# Patient Record
Sex: Female | Born: 1980 | Race: Black or African American | Hispanic: No | Marital: Married | Smoking: Never smoker
Health system: Southern US, Community
[De-identification: ages and names within clinical notes are randomized; demographics above are authoritative.]

## PROBLEM LIST (undated history)

## (undated) DIAGNOSIS — F419 Anxiety disorder, unspecified: Secondary | ICD-10-CM

## (undated) DIAGNOSIS — F32A Depression, unspecified: Secondary | ICD-10-CM

## (undated) DIAGNOSIS — Z8041 Family history of malignant neoplasm of ovary: Secondary | ICD-10-CM

## (undated) DIAGNOSIS — D259 Leiomyoma of uterus, unspecified: Secondary | ICD-10-CM

## (undated) DIAGNOSIS — D649 Anemia, unspecified: Secondary | ICD-10-CM

## (undated) HISTORY — DX: Depression, unspecified: F32.A

## (undated) HISTORY — PX: TUBAL LIGATION: SHX77

## (undated) HISTORY — DX: Anxiety disorder, unspecified: F41.9

## (undated) HISTORY — PX: OTHER SURGICAL HISTORY: SHX169

## (undated) HISTORY — DX: Family history of malignant neoplasm of ovary: Z80.41

---

## 2006-08-08 ENCOUNTER — Emergency Department (HOSPITAL_COMMUNITY): Admission: EM | Admit: 2006-08-08 | Discharge: 2006-08-08 | Payer: Self-pay | Admitting: Emergency Medicine

## 2006-10-05 ENCOUNTER — Emergency Department (HOSPITAL_COMMUNITY): Admission: EM | Admit: 2006-10-05 | Discharge: 2006-10-05 | Payer: Self-pay | Admitting: Emergency Medicine

## 2012-07-30 ENCOUNTER — Encounter (HOSPITAL_BASED_OUTPATIENT_CLINIC_OR_DEPARTMENT_OTHER): Payer: Self-pay | Admitting: *Deleted

## 2012-07-30 ENCOUNTER — Emergency Department (HOSPITAL_BASED_OUTPATIENT_CLINIC_OR_DEPARTMENT_OTHER)
Admission: EM | Admit: 2012-07-30 | Discharge: 2012-07-30 | Disposition: A | Discharge status: Home / Self Care | Payer: BC Managed Care – PPO | Attending: Emergency Medicine | Admitting: Emergency Medicine

## 2012-07-30 DIAGNOSIS — R5381 Other malaise: Secondary | ICD-10-CM | POA: Insufficient documentation

## 2012-07-30 DIAGNOSIS — R5383 Other fatigue: Secondary | ICD-10-CM | POA: Insufficient documentation

## 2012-07-30 DIAGNOSIS — N390 Urinary tract infection, site not specified: Secondary | ICD-10-CM | POA: Insufficient documentation

## 2012-07-30 LAB — URINALYSIS, ROUTINE W REFLEX MICROSCOPIC
Nitrite: POSITIVE — AB
Specific Gravity, Urine: 1.017 (ref 1.005–1.030)
Urobilinogen, UA: 0.2 mg/dL (ref 0.0–1.0)
pH: 5 (ref 5.0–8.0)

## 2012-07-30 LAB — URINE MICROSCOPIC-ADD ON

## 2012-07-30 LAB — PREGNANCY, URINE: Preg Test, Ur: NEGATIVE

## 2012-07-30 MED ORDER — NITROFURANTOIN MONOHYD MACRO 100 MG PO CAPS
100.0000 mg | ORAL_CAPSULE | Freq: Once | ORAL | Status: AC
  Administered 2012-07-30: 100 mg via ORAL
  Filled 2012-07-30: qty 1

## 2012-07-30 MED ORDER — NITROFURANTOIN MONOHYD MACRO 100 MG PO CAPS: 100.0000 mg | ORAL_CAPSULE | Freq: Two times a day (BID) | ORAL | Status: DC

## 2012-07-30 MED ORDER — PHENAZOPYRIDINE HCL 200 MG PO TABS: 200.0000 mg | ORAL_TABLET | Freq: Three times a day (TID) | ORAL | Status: DC

## 2012-07-30 MED ORDER — PHENAZOPYRIDINE HCL 100 MG PO TABS
200.0000 mg | ORAL_TABLET | Freq: Once | ORAL | Status: AC
  Administered 2012-07-30: 200 mg via ORAL
  Filled 2012-07-30: qty 2

## 2012-07-30 NOTE — ED Notes (Signed)
C/o urinary frequency, dysuria, and lower abd pain since midnight. Denies fever or chills.

## 2012-07-30 NOTE — ED Notes (Signed)
MD at bedside. 

## 2012-07-30 NOTE — ED Provider Notes (Signed)
History     CSN: [id number]  Arrival date & time 07/30/12  4098   First MD Initiated Contact with Patient 07/30/12 0324      Chief Complaint  Patient presents with  . Urinary Frequency    (Consider location/radiation/quality/duration/timing/severity/associated sxs/prior treatment) HPI Is a 31 year old female with several day history of urinary frequency, urgency and burning with urination. The symptoms have been moderate. She states her urine has been dark. The symptoms of.been mitigated by increased fluid intake. This morning about midnight she developed right suprapubic pain. She also has general malaise. She denies nausea, vomiting, fever or chills. She denies lower back pain. She states the symptoms are like previous urinary tract infections.  History reviewed. No pertinent past medical history.  History reviewed. No pertinent past surgical history.  History reviewed. No pertinent family history.  History  Substance Use Topics  . Smoking status: Not on file  . Smokeless tobacco: Not on file  . Alcohol Use: No    OB History    Grav Para Term Preterm Abortions TAB SAB Ect Mult Living                  Review of Systems  All other systems reviewed and are negative.    Allergies  Penicillins  Home Medications  No current outpatient prescriptions on file.  BP 112/68  Pulse 82  Temp 98.1 F (36.7 C) (Oral)  Resp 16  Ht 5\' 4"  (1.626 m)  Wt 150 lb (68.04 kg)  BMI 25.75 kg/m2  SpO2 100%  LMP 07/18/2012  Physical Exam General: Well-developed, well-nourished female in no acute distress; appearance consistent with age of record HENT: normocephalic, atraumatic Eyes: pupils equal round and reactive to light; extraocular muscles intact Neck: supple Heart: regular rate and rhythm Lungs: clear to auscultation bilaterally Abdomen: soft; nondistended; right suprapubic tenderness; no masses or hepatosplenomegaly; bowel sounds present GU: No CVA  tenderness Extremities: No deformity; full range of motion Neurologic: Awake, alert and oriented; motor function intact in all extremities and symmetric; no facial droop Skin: Warm and dry Psychiatric: Normal mood and affect    ED Course  Procedures (including critical care time)     MDM   Nursing notes and vitals signs, including pulse oximetry, reviewed.  Summary of this visit's results, reviewed by myself:  Labs:  Results for orders placed during the hospital encounter of 07/30/12 (from the past 24 hour(s))  URINALYSIS, ROUTINE W REFLEX MICROSCOPIC     Status: Abnormal   Collection Time   07/30/12  3:24 AM      Component Value Range   Color, Urine YELLOW  YELLOW   APPearance CLOUDY (*) CLEAR   Specific Gravity, Urine 1.017  1.005 - 1.030   pH 5.0  5.0 - 8.0   Glucose, UA NEGATIVE  NEGATIVE mg/dL   Hgb urine dipstick SMALL (*) NEGATIVE   Bilirubin Urine NEGATIVE  NEGATIVE   Ketones, ur NEGATIVE  NEGATIVE mg/dL   Protein, ur 30 (*) NEGATIVE mg/dL   Urobilinogen, UA 0.2  0.0 - 1.0 mg/dL   Nitrite POSITIVE (*) NEGATIVE   Leukocytes, UA LARGE (*) NEGATIVE  PREGNANCY, URINE     Status: Normal   Collection Time   07/30/12  3:24 AM      Component Value Range   Preg Test, Ur NEGATIVE  NEGATIVE  URINE MICROSCOPIC-ADD ON     Status: Abnormal   Collection Time   07/30/12  3:24 AM      Component Value  Range   Squamous Epithelial / LPF RARE  RARE   WBC, UA TOO NUMEROUS TO COUNT  <3 WBC/hpf   RBC / HPF 7-10  <3 RBC/hpf   Bacteria, UA MANY (*) RARE            Hanley Seamen, MD 07/30/12 [phone]

## 2012-08-01 LAB — URINE CULTURE: Colony Count: >=100000

## 2012-08-02 NOTE — ED Notes (Signed)
+  Urine. Patient treated with Macrobid. Sensitive to same. Per protocol MD. °

## 2014-05-29 ENCOUNTER — Emergency Department (HOSPITAL_BASED_OUTPATIENT_CLINIC_OR_DEPARTMENT_OTHER)
Admission: EM | Admit: 2014-05-29 | Discharge: 2014-05-29 | Disposition: A | Discharge status: Home / Self Care | Payer: BC Managed Care – PPO | Attending: Emergency Medicine | Admitting: Emergency Medicine

## 2014-05-29 ENCOUNTER — Encounter (HOSPITAL_BASED_OUTPATIENT_CLINIC_OR_DEPARTMENT_OTHER): Payer: Self-pay | Admitting: Emergency Medicine

## 2014-05-29 DIAGNOSIS — Z79899 Other long term (current) drug therapy: Secondary | ICD-10-CM | POA: Diagnosis not present

## 2014-05-29 DIAGNOSIS — J029 Acute pharyngitis, unspecified: Secondary | ICD-10-CM | POA: Diagnosis present

## 2014-05-29 DIAGNOSIS — J040 Acute laryngitis: Secondary | ICD-10-CM | POA: Diagnosis not present

## 2014-05-29 DIAGNOSIS — J209 Acute bronchitis, unspecified: Secondary | ICD-10-CM | POA: Diagnosis not present

## 2014-05-29 DIAGNOSIS — Z880 Allergy status to penicillin: Secondary | ICD-10-CM | POA: Diagnosis not present

## 2014-05-29 MED ORDER — AZITHROMYCIN 250 MG PO TABS: ORAL_TABLET | ORAL | Status: DC

## 2014-05-29 MED ORDER — ALBUTEROL SULFATE HFA 108 (90 BASE) MCG/ACT IN AERS: 1.0000 | INHALATION_SPRAY | Freq: Four times a day (QID) | RESPIRATORY_TRACT | Status: DC | PRN

## 2014-05-29 MED ORDER — IBUPROFEN 800 MG PO TABS: 800.0000 mg | ORAL_TABLET | Freq: Three times a day (TID) | ORAL | Status: DC

## 2014-05-29 NOTE — Discharge Instructions (Signed)
Acute Bronchitis Bronchitis is inflammation of the airways that extend from the windpipe into the lungs (bronchi). The inflammation often causes mucus to develop. This leads to a cough, which is the most common symptom of bronchitis.  In acute bronchitis, the condition usually develops suddenly and goes away over time, usually in a couple weeks. Smoking, allergies, and asthma can make bronchitis worse. Repeated episodes of bronchitis may cause further lung problems.  CAUSES Acute bronchitis is most often caused by the same virus that causes a cold. The virus can spread from person to person (contagious) through coughing, sneezing, and touching contaminated objects. SIGNS AND SYMPTOMS   Cough.   Fever.   Coughing up mucus.   Body aches.   Chest congestion.   Chills.   Shortness of breath.   Sore throat.  DIAGNOSIS  Acute bronchitis is usually diagnosed through a physical exam. Your health care provider will also ask you questions about your medical history. Tests, such as chest X-rays, are sometimes done to rule out other conditions.  TREATMENT  Acute bronchitis usually goes away in a couple weeks. Oftentimes, no medical treatment is necessary. Medicines are sometimes given for relief of fever or cough. Antibiotic medicines are usually not needed but may be prescribed in certain situations. In some cases, an inhaler may be recommended to help reduce shortness of breath and control the cough. A cool mist vaporizer may also be used to help thin bronchial secretions and make it easier to clear the chest.  HOME CARE INSTRUCTIONS  Get plenty of rest.   Drink enough fluids to keep your urine clear or pale yellow (unless you have a medical condition that requires fluid restriction). Increasing fluids may help thin your respiratory secretions (sputum) and reduce chest congestion, and it will prevent dehydration.   Take medicines only as directed by your health care provider.  If  you were prescribed an antibiotic medicine, finish it all even if you start to feel better.  Avoid smoking and secondhand smoke. Exposure to cigarette smoke or irritating chemicals will make bronchitis worse. If you are a smoker, consider using nicotine gum or skin patches to help control withdrawal symptoms. Quitting smoking will help your lungs heal faster.   Reduce the chances of another bout of acute bronchitis by washing your hands frequently, avoiding people with cold symptoms, and trying not to touch your hands to your mouth, nose, or eyes.   Keep all follow-up visits as directed by your health care provider.  SEEK MEDICAL CARE IF: Your symptoms do not improve after 1 week of treatment.  SEEK IMMEDIATE MEDICAL CARE IF:  You develop an increased fever or chills.   You have chest pain.   You have severe shortness of breath.  You have bloody sputum.   You develop dehydration.  You faint or repeatedly feel like you are going to pass out.  You develop repeated vomiting.  You develop a severe headache. MAKE SURE YOU:   Understand these instructions.  Will watch your condition.  Will get help right away if you are not doing well or get worse. Document Released: 09/19/2004 Document Revised: 12/27/2013 Document Reviewed: 02/02/2013 ExitCare Patient Information 2015 [employer]. This information is not intended to replace advice given to you by your health care provider. Make sure you discuss any questions you have with your health care provider. Laryngitis At the top of your windpipe is your voice box. It is the source of your voice. Inside your voice box are 2   bands of muscles called vocal cords. When you breathe, your vocal cords are relaxed and open so that air can get into the lungs. When you decide to say something, these cords come together and vibrate. The sound from these vibrations goes into your throat and comes out through your mouth as sound. Laryngitis is an  inflammation of the vocal cords that causes hoarseness, cough, loss of voice, sore throat, and dry throat. Laryngitis can be temporary (acute) or long-term (chronic). Most cases of acute laryngitis improve with time.Chronic laryngitis lasts for more than 3 weeks. CAUSES Laryngitis can often be related to excessive smoking, talking, or yelling, as well as inhalation of toxic fumes and allergies. Acute laryngitis is usually caused by a viral infection, vocal strain, measles or mumps, or bacterial infections. Chronic laryngitis is usually caused by vocal cord strain, vocal cord injury, postnasal drip, growths on the vocal cords, or acid reflux. SYMPTOMS   Cough.  Sore throat.  Dry throat. RISK FACTORS  Respiratory infections.  Exposure to irritating substances, such as cigarette smoke, excessive amounts of alcohol, stomach acids, and workplace chemicals.  Voice trauma, such as vocal cord injury from shouting or speaking too loud. DIAGNOSIS  Your cargiver will perform a physical exam. During the physical exam, your caregiver will examine your throat. The most common sign of laryngitis is hoarseness. Laryngoscopy may be necessary to confirm the diagnosis of this condition. This procedure allows your caregiver to look into the larynx. HOME CARE INSTRUCTIONS  Drink enough fluids to keep your urine clear or pale yellow.  Rest until you no longer have symptoms or as directed by your caregiver.  Breathe in moist air.  Take all medicine as directed by your caregiver.  Do not smoke.  Talk as little as possible (this includes whispering).  Write on paper instead of talking until your voice is back to normal.  Follow up with your caregiver if your condition has not improved after 10 days. SEEK MEDICAL CARE IF:   You have trouble breathing.  You cough up blood.  You have persistent fever.  You have increasing pain.  You have difficulty swallowing. MAKE SURE YOU:  Understand these  instructions.  Will watch your condition.  Will get help right away if you are not doing well or get worse. Document Released: 08/12/2005 Document Revised: 11/04/2011 Document Reviewed: 10/18/2010 Exit[employer] Patient Information 2015 [employer]. This information is not intended to replace advice given to you by your health care provider. Make sure you discuss any questions you have with your health care provider.  

## 2014-05-29 NOTE — ED Notes (Signed)
Since last week patient has had a sore throat, cough, and headache.

## 2014-05-29 NOTE — ED Provider Notes (Signed)
CSN: [id number]     Arrival date & time 05/29/14  0650 History   First MD Initiated Contact with Patient 05/29/14 0725     Chief Complaint  Patient presents with  . Sore Throat     (Consider location/radiation/quality/duration/timing/severity/associated sxs/prior Treatment) HPI Patient has had one weeks worth of symptoms. She reports sore throat, cough productive of yellow mucus, she reports she's had fever up to 101. The patient reports that the cough and mucous production is getting worse. No chest pain no fevers or breath. Painful sore throat worse with swallowing and talking. Voice is hoarse. She reports she's also had some generalized headache. Patient been trying over-the-counter cough and cold remedies without relief.  History reviewed. No pertinent past medical history. History reviewed. No pertinent past surgical history. No family history on file. History  Substance Use Topics  . Smoking status: Never Smoker   . Smokeless tobacco: Not on file  . Alcohol Use: No   OB History   Grav Para Term Preterm Abortions TAB SAB Ect Mult Living                 Review of Systems 10 Systems reviewed and are negative for acute change except as noted in the HPI.    Allergies  Penicillins  Home Medications   Prior to Admission medications   Medication Sig Start Date End Date Taking? Authorizing Provider  albuterol (PROVENTIL HFA;VENTOLIN HFA) 108 (90 BASE) MCG/ACT inhaler Inhale 1-2 puffs into the lungs every 6 (six) hours as needed for wheezing or shortness of breath. 05/29/14   Charlesetta Shanks, MD  azithromycin (ZITHROMAX Z-PAK) 250 MG tablet 2 po day one, then 1 daily x 4 days 05/29/14   Charlesetta Shanks, MD  ibuprofen (ADVIL,MOTRIN) 800 MG tablet Take 1 tablet (800 mg total) by mouth 3 (three) times daily. 05/29/14   Charlesetta Shanks, MD  nitrofurantoin, macrocrystal-monohydrate, (MACROBID) 100 MG capsule Take 1 capsule (100 mg total) by mouth 2 (two) times daily. 07/30/12   Karen Chafe  Molpus, MD  phenazopyridine (PYRIDIUM) 200 MG tablet Take 1 tablet (200 mg total) by mouth 3 (three) times daily. 07/30/12   John L Molpus, MD   BP 120/80  Pulse 76  Temp(Src) 98.2 F (36.8 C) (Oral)  Resp 18  SpO2 100% Physical Exam  Constitutional: She is oriented to person, place, and time. She appears well-developed and well-nourished.  HENT:  Head: Normocephalic and atraumatic.  Patient's voice is slightly hoarse but clear. Posterior oropharynx is widely patent there is no erythema or exu date. Tonsils are not enlarged. These mucous membranes are pink and moist. Neck is supple without significant lymphadenopathy.  Eyes: EOM are normal. Pupils are equal, round, and reactive to light.  Neck: Neck supple.  Cardiovascular: Normal rate, regular rhythm, normal heart sounds and intact distal pulses.   Pulmonary/Chest: Effort normal and breath sounds normal.  Abdominal: Soft. Bowel sounds are normal. She exhibits no distension. There is no tenderness.  Musculoskeletal: Normal range of motion. She exhibits no edema.  Neurological: She is alert and oriented to person, place, and time. She has normal strength. Coordination normal. GCS eye subscore is 4. GCS verbal subscore is 5. GCS motor subscore is 6.  Skin: Skin is warm, dry and intact.  Psychiatric: She has a normal mood and affect.    ED Course  Procedures (including critical care time) Labs Review Labs Reviewed - No data to display  Imaging Review No results found.   EKG Interpretation None  MDM   Final diagnoses:  Acute bronchitis, unspecified organism  Laryngitis   The patient identifies a cough with mucous production. She does report fever to 101. She reports symptoms are not improving at one weeks duration. At this point actually treated with a Z-Pak and ibuprofen. Her general appearance is very good. She is alert and well without respiratory distress. She does have laryngitis however the throat has no exudate no  tonsillar enlargement and no erythema.    Charlesetta Shanks, MD 05/29/14 [phone]

## 2015-01-25 ENCOUNTER — Other Ambulatory Visit (HOSPITAL_COMMUNITY): Payer: Self-pay | Admitting: Gynecology

## 2015-01-25 DIAGNOSIS — Z3141 Encounter for fertility testing: Secondary | ICD-10-CM

## 2015-01-31 ENCOUNTER — Ambulatory Visit (HOSPITAL_COMMUNITY)
Admission: RE | Admit: 2015-01-31 | Discharge: 2015-01-31 | Disposition: A | Discharge status: Home / Self Care | Payer: BLUE CROSS/BLUE SHIELD | Source: Ambulatory Visit | Attending: Gynecology | Admitting: Gynecology

## 2015-01-31 DIAGNOSIS — Z9851 Tubal ligation status: Secondary | ICD-10-CM | POA: Diagnosis not present

## 2015-01-31 DIAGNOSIS — Z3141 Encounter for fertility testing: Secondary | ICD-10-CM

## 2015-01-31 MED ORDER — IOHEXOL 300 MG/ML  SOLN
30.0000 mL | Freq: Once | INTRAMUSCULAR | Status: AC | PRN
  Administered 2015-01-31: 30 mL

## 2016-05-16 IMAGING — RF DG HYSTEROGRAM
2 series · 2 of 2 positions shown · non-contrast
Comparison: None.

CLINICAL DATA: Fertility testing. Previous tubal ligation with
reversal surgery in 0109. Previous miscarriages in still birth.

EXAM:
HYSTEROSALPINGOGRAM
TECHNIQUE: Hysterosalpingogram was performed by the ordering physician under
fluoroscopy. Fluoroscopic images were submitted for radiologic
interpretation following the procedure. Please see the procedural
report for the amount of contrast and the fluoroscopy time utilized.

[Series 1: run · 1 of 1 slices shown (1 of 2)]
[im 1/1]
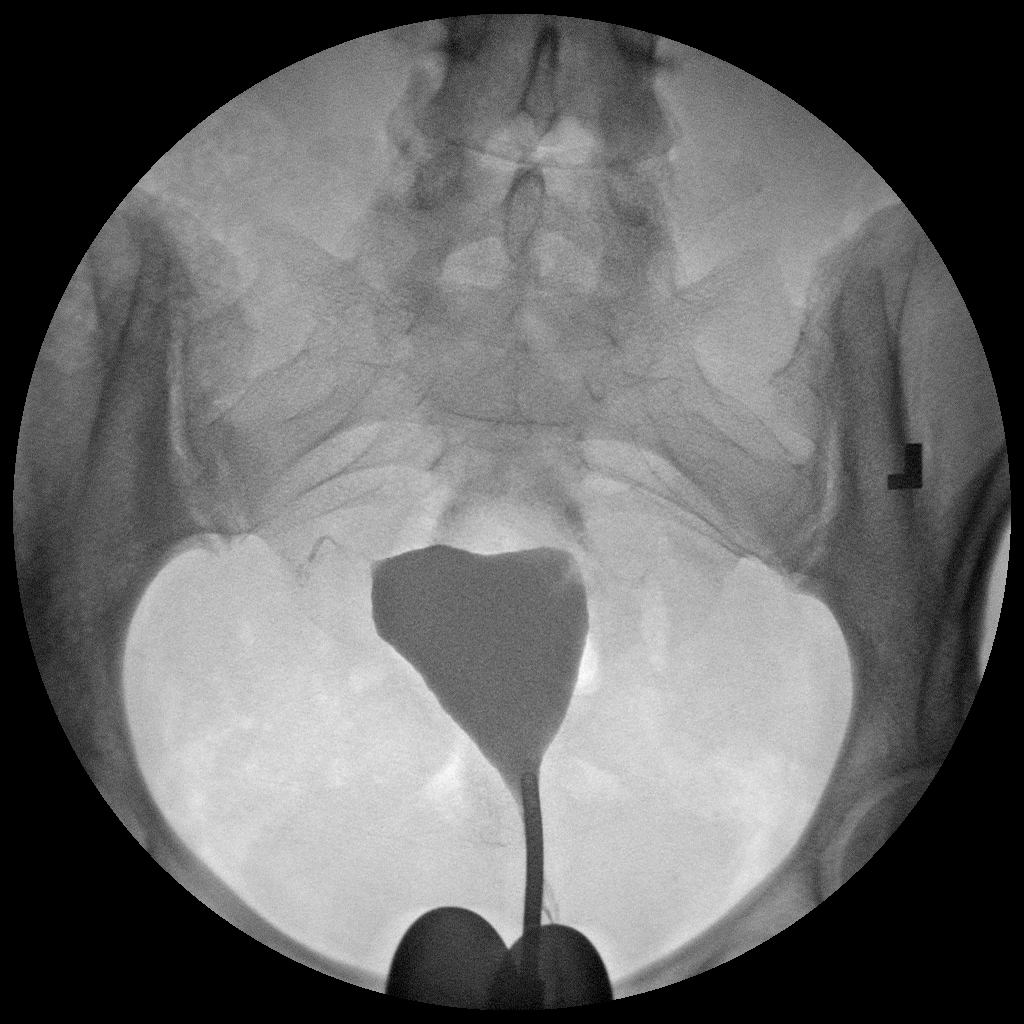

[Series 2: run · 1 of 1 slices shown (2 of 2)]
[im 1/1]
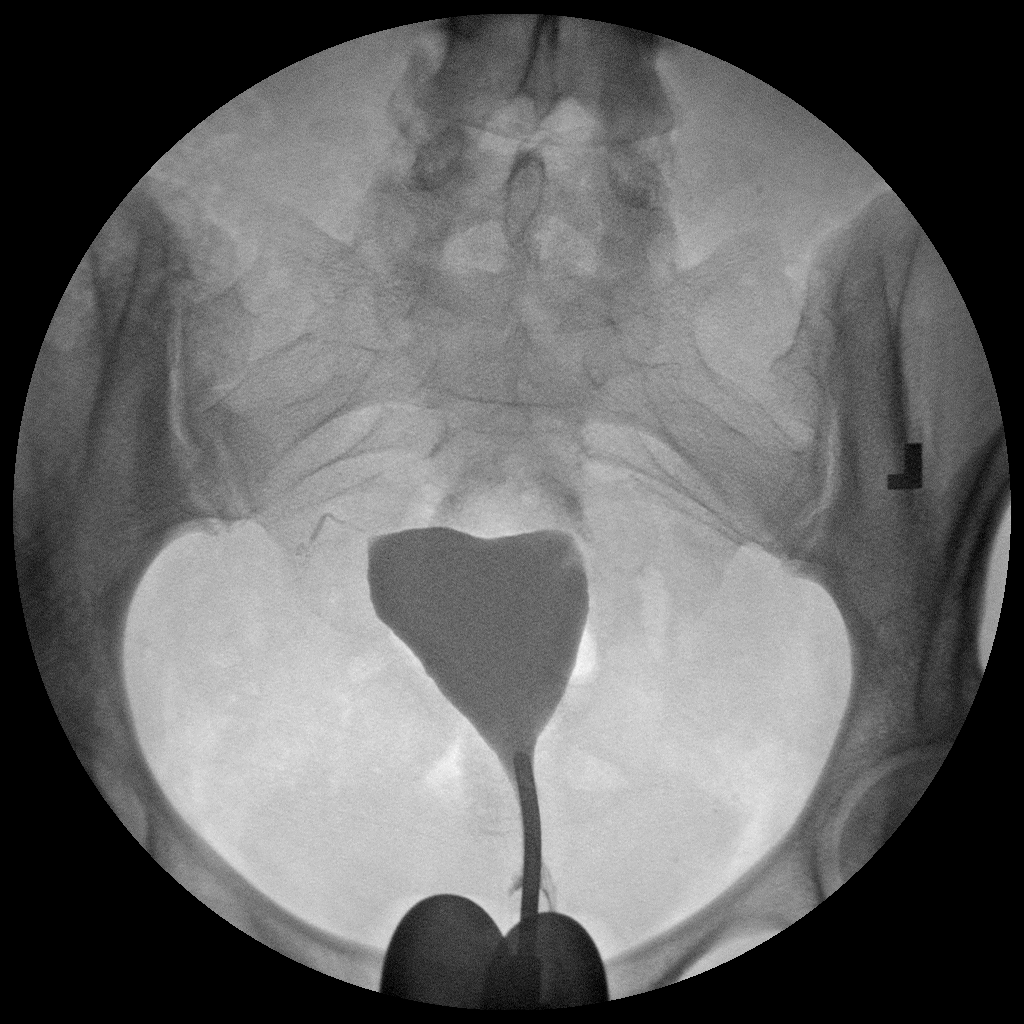

[2 of 2 positions shown; findings below may reference images not displayed]

FINDINGS: Two fluoroscopic images are submitted which show normal appearance
of endometrial cavity. No evidence of Mullerian duct anomaly.

There is no contrast opacification of the left fallopian tube.

The right fallopian tube shows opacification of the proximal most
portion, with a proximal occlusion.

No intraperitoneal spill of contrast from either fallopian tube is
demonstrated.
IMPRESSION: Proximal occlusion of right fallopian tube. Noncontrast
opacification of left fallopian tube. No intraperitoneal spill of
contrast from either fallopian tube demonstrated.

## 2018-08-06 DIAGNOSIS — F339 Major depressive disorder, recurrent, unspecified: Secondary | ICD-10-CM | POA: Insufficient documentation

## 2018-08-07 DIAGNOSIS — R45851 Suicidal ideations: Secondary | ICD-10-CM | POA: Insufficient documentation

## 2018-10-29 ENCOUNTER — Emergency Department (HOSPITAL_COMMUNITY)
Admission: EM | Admit: 2018-10-29 | Discharge: 2018-10-29 | Disposition: A | Discharge status: Home / Self Care | Payer: No Typology Code available for payment source | Attending: Emergency Medicine | Admitting: Emergency Medicine

## 2018-10-29 ENCOUNTER — Emergency Department (HOSPITAL_COMMUNITY): Payer: No Typology Code available for payment source

## 2018-10-29 DIAGNOSIS — S199XXA Unspecified injury of neck, initial encounter: Secondary | ICD-10-CM | POA: Diagnosis present

## 2018-10-29 DIAGNOSIS — S161XXA Strain of muscle, fascia and tendon at neck level, initial encounter: Secondary | ICD-10-CM | POA: Insufficient documentation

## 2018-10-29 DIAGNOSIS — Y92410 Unspecified street and highway as the place of occurrence of the external cause: Secondary | ICD-10-CM | POA: Diagnosis not present

## 2018-10-29 DIAGNOSIS — Z79899 Other long term (current) drug therapy: Secondary | ICD-10-CM | POA: Insufficient documentation

## 2018-10-29 DIAGNOSIS — Y9389 Activity, other specified: Secondary | ICD-10-CM | POA: Insufficient documentation

## 2018-10-29 DIAGNOSIS — Y999 Unspecified external cause status: Secondary | ICD-10-CM | POA: Insufficient documentation

## 2018-10-29 MED ORDER — IBUPROFEN 600 MG PO TABS: 600.0000 mg | ORAL_TABLET | Freq: Four times a day (QID) | ORAL | 0 refills | Status: DC | PRN

## 2018-10-29 MED ORDER — CYCLOBENZAPRINE HCL 5 MG PO TABS: 5.0000 mg | ORAL_TABLET | Freq: Three times a day (TID) | ORAL | 0 refills | Status: DC | PRN

## 2018-10-29 MED ORDER — CYCLOBENZAPRINE HCL 10 MG PO TABS
5.0000 mg | ORAL_TABLET | Freq: Once | ORAL | Status: AC
  Administered 2018-10-29: 5 mg via ORAL
  Filled 2018-10-29: qty 1

## 2018-10-29 MED ORDER — IBUPROFEN 800 MG PO TABS
800.0000 mg | ORAL_TABLET | Freq: Once | ORAL | Status: AC
  Administered 2018-10-29: 800 mg via ORAL
  Filled 2018-10-29: qty 1

## 2018-10-29 NOTE — ED Notes (Signed)
Patient ambulated to radiology with radiology tech.

## 2018-10-29 NOTE — Discharge Instructions (Signed)
Take motrin for pain   Take flexeril for muscle spasms.   See your doctor  Expect to be stiff and sore for several days   Return to ER if you have worse neck pain, shoulder pain, chest or abdominal pain, vomiting
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
## 2019-03-16 DIAGNOSIS — R87619 Unspecified abnormal cytological findings in specimens from cervix uteri: Secondary | ICD-10-CM | POA: Insufficient documentation

## 2019-03-16 DIAGNOSIS — F418 Other specified anxiety disorders: Secondary | ICD-10-CM | POA: Insufficient documentation

## 2019-03-16 DIAGNOSIS — D649 Anemia, unspecified: Secondary | ICD-10-CM | POA: Insufficient documentation

## 2019-03-16 DIAGNOSIS — N852 Hypertrophy of uterus: Secondary | ICD-10-CM | POA: Insufficient documentation

## 2020-04-12 ENCOUNTER — Ambulatory Visit: Payer: No Typology Code available for payment source | Admitting: Obstetrics and Gynecology

## 2020-05-15 ENCOUNTER — Ambulatory Visit (INDEPENDENT_AMBULATORY_CARE_PROVIDER_SITE_OTHER): Payer: No Typology Code available for payment source | Admitting: Obstetrics and Gynecology

## 2020-05-15 ENCOUNTER — Other Ambulatory Visit (HOSPITAL_COMMUNITY)
Admission: RE | Admit: 2020-05-15 | Discharge: 2020-05-15 | Disposition: A | Discharge status: Home / Self Care | Payer: BLUE CROSS/BLUE SHIELD | Source: Ambulatory Visit | Attending: Obstetrics and Gynecology | Admitting: Obstetrics and Gynecology

## 2020-05-15 ENCOUNTER — Encounter: Payer: Self-pay | Admitting: Obstetrics and Gynecology

## 2020-05-15 ENCOUNTER — Other Ambulatory Visit: Payer: Self-pay

## 2020-05-15 VITALS — BP 120/70 | HR 71 | Resp 18 | Ht 64.0 in | Wt 167.0 lb

## 2020-05-15 DIAGNOSIS — Z0000 Encounter for general adult medical examination without abnormal findings: Secondary | ICD-10-CM | POA: Diagnosis not present

## 2020-05-15 DIAGNOSIS — Z113 Encounter for screening for infections with a predominantly sexual mode of transmission: Secondary | ICD-10-CM

## 2020-05-15 DIAGNOSIS — N920 Excessive and frequent menstruation with regular cycle: Secondary | ICD-10-CM | POA: Diagnosis not present

## 2020-05-15 DIAGNOSIS — Z124 Encounter for screening for malignant neoplasm of cervix: Secondary | ICD-10-CM | POA: Insufficient documentation

## 2020-05-15 DIAGNOSIS — F3281 Premenstrual dysphoric disorder: Secondary | ICD-10-CM

## 2020-05-15 DIAGNOSIS — Z01419 Encounter for gynecological examination (general) (routine) without abnormal findings: Secondary | ICD-10-CM

## 2020-05-15 DIAGNOSIS — Z1231 Encounter for screening mammogram for malignant neoplasm of breast: Secondary | ICD-10-CM

## 2020-05-15 MED ORDER — FLUOXETINE HCL 10 MG PO CAPS: 10.0000 mg | ORAL_CAPSULE | Freq: Every day | ORAL | 11 refills | Status: DC

## 2020-05-15 NOTE — Progress Notes (Signed)
Gynecology Annual Exam  PCP: Julie Ochoa, No Pcp Per  Chief Complaint:  Chief Complaint  Julie Ochoa presents with  . Gynecologic Exam    annual exam    History of Present Illness: Julie Ochoa is a 39 y.o. No obstetric history on file. presents for annual exam. The Julie Ochoa has no complaints today.   LMP: Julie Ochoa's last menstrual period was 04/23/2020. Average Interval: regular, monthly  Duration of flow: 5 days Heavy Menses: yes Clots: yes Intermenstrual Bleeding: no Postcoital Bleeding: no Dysmenorrhea: yes   Julie Ochoa reports that she is here to discuss her moods during her periods at the encouragement of her therapist.  She reports that the week before and the week of her period she feels very fragile and cries easily.  The rest of the month she generally okay.  She has taken Prozac in the past since 2019.  She stopped Prozac in January 2021 because she is feeling better.  She reports that the Prozac did help her depression.    She reports a history of menorrhagia.  She reports that her periods generally last for 5 days.  She has 2 days of heavy bleeding.  She generally passes large clots.  She has to change a pad and tampon more frequently than once an hour.  She has gushing and flooding which sometimes cause her to  miss work.  She does have a history of anemia and takes an iron supplement for this reason.  She had a tubal ligation in the past but reports that the reversal surgery she had was unsuccessful and her tubes are still blocked.  The Julie Ochoa is sexually active. She currently uses tubal, reversal was unsucessful , for contraception. She has dyspareunia.  The Julie Ochoa does perform self breast exams.  There is notable family history of breast or ovarian cancer in her family.  The Julie Ochoa wears seatbelts: no.   The Julie Ochoa has regular exercise: yes.    The Julie Ochoa reports current symptoms of depression.    Review of Systems: ROS  Past Medical History:  Julie Ochoa Active Problem  List   Diagnosis Date Noted  . Abnormal cervical Papanicolaou smear 03/16/2019  . Anemia 03/16/2019  . Enlarged uterus 03/16/2019  . Mixed anxiety and depressive disorder 03/16/2019  . Suicidal ideation 08/07/2018  . Major depressive disorder, recurrent episode ([place]) 08/06/2018    Past Surgical History:  No past surgical history on file.  Gynecologic History:  Julie Ochoa's last menstrual period was 04/23/2020. Contraception: tubal ligation Last Pap: Results were: unknown Last mammogram: under 40, about to start screenign  Obstetric History: No obstetric history on file.  Family History:  No family history on file.  Social History:  Social History   Socioeconomic History  . Marital status: Married    Spouse name: Not on file  . Number of children: Not on file  . Years of education: Not on file  . Highest education level: Not on file  Occupational History  . Not on file  Tobacco Use  . Smoking status: Never Smoker  . Smokeless tobacco: Never Used  Substance and Sexual Activity  . Alcohol use: No  . Drug use: No  . Sexual activity: Not Currently  Other Topics Concern  . Not on file  Social History Narrative  . Not on file   Social Determinants of Health   Financial Resource Strain:   . Difficulty of Paying Living Expenses: Not on file  Food Insecurity:   . Worried About Charity fundraiser in  the Last Year: Not on file  . Ran Out of Food in the Last Year: Not on file  Transportation Needs:   . Lack of Transportation (Medical): Not on file  . Lack of Transportation (Non-Medical): Not on file  Physical Activity:   . Days of Exercise per Week: Not on file  . Minutes of Exercise per Session: Not on file  Stress:   . Feeling of Stress : Not on file  Social Connections:   . Frequency of Communication with Friends and Family: Not on file  . Frequency of Social Gatherings with Friends and Family: Not on file  . Attends Religious Services: Not on file  . Active  Member of Clubs or Organizations: Not on file  . Attends Archivist Meetings: Not on file  . Marital Status: Not on file  Intimate Partner Violence:   . Fear of Current or Ex-Partner: Not on file  . Emotionally Abused: Not on file  . Physically Abused: Not on file  . Sexually Abused: Not on file    Allergies:  Allergies  Allergen Reactions  . Amoxicillin Rash  . Penicillins Rash    Did it involve swelling of the face/tongue/throat, SOB, or low BP? no Did it involve sudden or severe rash/hives, skin peeling, or any reaction on the inside of your mouth or nose? yes Did you need to seek medical attention at a hospital or doctor's office? no When did it last happen?2000 If all above answers are "NO", may proceed with cephalosporin use.     Medications: Prior to Admission medications   Medication Sig Start Date End Date Taking? Authorizing Provider  albuterol (PROVENTIL HFA;VENTOLIN HFA) 108 (90 BASE) MCG/ACT inhaler Inhale 1-2 puffs into the lungs every 6 (six) hours as needed for wheezing or shortness of breath. 05/29/14  Yes Charlesetta Shanks, MD  azithromycin (ZITHROMAX Z-PAK) 250 MG tablet 2 po day one, then 1 daily x 4 days 05/29/14  Yes Pfeiffer, Jeannie Done, MD  Ferrous Sulfate (IRON PO) Take 1 tablet by mouth daily.   Yes [provider]  FLUoxetine (PROZAC) 10 MG capsule Take 10 mg by mouth daily as needed (anxiety).  09/04/18  Yes [provider]  hydrocortisone 2.5 % cream Apply to affected areas on face twice daily as needed. 02/22/19  Yes [provider]  ibuprofen (ADVIL,MOTRIN) 600 MG tablet Take 1 tablet (600 mg total) by mouth every 6 (six) hours as needed. 10/29/18  Yes Drenda Freeze, MD  nitrofurantoin, macrocrystal-monohydrate, (MACROBID) 100 MG capsule Take 1 capsule (100 mg total) by mouth 2 (two) times daily. 07/30/12  Yes Molpus, John, MD  phenazopyridine (PYRIDIUM) 200 MG tablet Take 1 tablet (200 mg total) by mouth 3 (three)  times daily. 07/30/12  Yes Molpus, John, MD    Physical Exam Vitals: Blood pressure 120/70, pulse 71, resp. rate 18, height 5\' 4"  (1.626 m), weight 167 lb (75.8 kg), last menstrual period 04/23/2020, SpO2 99 %.  General: NAD HEENT: normocephalic, anicteric Thyroid: no enlargement, no palpable nodules Pulmonary: No increased work of breathing, CTAB Cardiovascular: RRR, distal pulses 2+ Breast: Breast symmetrical, no tenderness, no palpable nodules or masses, no skin or nipple retraction present, no nipple discharge.  No axillary or supraclavicular lymphadenopathy. Abdomen: NABS, soft, non-tender, non-distended.  Umbilicus without lesions.  No hepatomegaly, splenomegaly or masses palpable. No evidence of hernia  Genitourinary:  External: Normal external female genitalia.  Normal urethral meatus, normal Bartholin's and Skene's glands.    Vagina: Normal vaginal mucosa,  no evidence of prolapse.    Cervix: Grossly normal in appearance, no bleeding  Uterus: Non-enlarged, mobile, normal contour.  No CMT  Adnexa: ovaries non-enlarged, no adnexal masses  Rectal: deferred  Lymphatic: no evidence of inguinal lymphadenopathy Extremities: no edema, erythema, or tenderness Neurologic: Grossly intact Psychiatric: mood appropriate, affect full  Female chaperone present for pelvic and breast  portions of the physical exam    Assessment: 39 y.o. No obstetric history on file. routine annual exam  Plan: Problem List Items Addressed This Visit    None    Visit Diagnoses    Encounter for annual routine gynecological examination    -  Primary   Health maintenance examination       Cervical cancer screening       Relevant Orders   Cytology, thin prep pap (cervical)   Menorrhagia with regular cycle       Relevant Orders   US PELVIS TRANSVAGINAL NON-OB (TV ONLY)   Screen for STD (sexually transmitted disease)       Premenstrual dysphoric disorder       Relevant Medications   FLUoxetine (PROZAC) 10  MG capsule   Breast cancer screening by mammogram       Relevant Orders   MM 3D SCREEN BREAST BILATERAL      1) Mammogram - recommend yearly screening mammogram.  Mammogram Was ordered today   2) STI screening  wasoffered and accepted  3) ASCCP guidelines and rational discussed.  Julie Ochoa opts for every 5 years screening interval  4) Contraception - the Julie Ochoa is currently using  tubal ligation.  She is happy with her current form of contraception and plans to continue  5) Colonoscopy -- Screening recommended starting at age 2 for average risk individuals, age 65 for individuals deemed at increased risk (including African Americans) and recommended to continue until age 62.  For Julie Ochoa age 36-85 individualized approach is recommended.  Gold standard screening is via colonoscopy, Cologuard screening is an acceptable alternative for Julie Ochoa unwilling or unable to undergo colonoscopy.  "Colorectal cancer screening for average?risk adults: 2018 guideline update from the American Cancer Society"[place]: A Cancer Journal for Clinicians: Jan 22, 2017   6) Routine healthcare maintenance including cholesterol, diabetes screening discussed managed by PCP  7) PMDD- discussed treatment options- Julie Ochoa desires to restart PROZAC, rx sent PHQ-9: 5 GAD-7: 3  8) Menorrhagia hx of fibroids, Julie Ochoa will follow up for [place] and to discuss management options further.   9)  Return in about 2 weeks (around 05/29/2020) for GYN visit and [place].   [facility], [facility] 05/15/2020, 10:28 AM

## 2020-05-15 NOTE — Patient Instructions (Addendum)
[facility] for Calcium and Vitamin D  Age (yr) Calcium Recommended Dietary Allowance (mg/day) Vitamin D Recommended Dietary Allowance (international units/day)  9-18 1,300 600  19-50 1,000 600  51-70 1,200 600  71 and older 1,200 800  Data from Institute of Medicine. Dietary reference intakes: calcium, vitamin D. [place], [place]: [employer]; 2011.     Mammogram A mammogram is a low energy X-ray of the breasts that is done to check for abnormal changes. This procedure can screen for and detect any changes that may indicate breast cancer. Mammograms are regularly done on women. A man may have a mammogram if he has a lump or swelling in his breast. A mammogram can also identify other changes and variations in the breast, such as:  Inflammation of the breast tissue (mastitis).  An infected area that contains a collection of pus (abscess).  A fluid-filled sac (cyst).  Fibrocystic changes. This is when breast tissue becomes denser, which can make the tissue feel rope-like or uneven under the skin.  Tumors that are not cancerous (benign). Tell a health care provider:  About any allergies you have.  If you have breast implants.  If you have had previous breast disease, biopsy, or surgery.  If you are breastfeeding.  If you are younger than age 40.  If you have a family history of breast cancer.  Whether you are pregnant or may be pregnant. What are the risks? Generally, this is a safe procedure. However, problems may occur, including:  Exposure to radiation. Radiation levels are very low with this test.  The results being misinterpreted.  The need for further tests.  The inability of the mammogram to detect certain cancers. What happens before the procedure?  Schedule your test about 1-2 weeks after your menstrual period if you are still menstruating. This is usually when your breasts are the least  tender.  If you have had a mammogram done at a different facility in the past, get the mammogram X-rays or have them sent to your current exam facility. The new and old images will be compared.  Wash your breasts and underarms on the day of the test.  Do not wear deodorants, perfumes, lotions, or powders anywhere on your body on the day of the test.  Remove any jewelry from your neck.  Wear clothes that you can change into and out of easily. What happens during the procedure?   You will undress from the waist up and put on a gown that opens in the front.  You will stand in front of the X-ray machine.  Each breast will be placed between two plastic or glass plates. The plates will compress your breast for a few seconds. Try to stay as relaxed as possible during the procedure. This does not cause any harm to your breasts and any discomfort you feel will be very brief.  X-rays will be taken from different angles of each breast. The procedure may vary among health care providers and hospitals. What happens after the procedure?  The mammogram will be examined by a specialist (radiologist).  You may need to repeat certain parts of the test, depending on the quality of the images. This is commonly done if the radiologist needs a better view of the breast tissue.  You may resume your normal activities.  It is up to you to get the results of your procedure. Ask your health care provider, or the department that is doing the procedure, when  your results will be ready. Summary  A mammogram is a low energy X-ray of the breasts that is done to check for abnormal changes. A man may have a mammogram if he has a lump or swelling in his breast.  If you have had a mammogram done at a different facility in the past, get the mammogram X-rays or have them sent to your current exam facility in order to compare them.  Schedule your test about 1-2 weeks after your menstrual period if you are still  menstruating.  For this test, each breast will be placed between two plastic or glass plates. The plates will compress your breast for a few seconds.  Ask when your test results will be ready. Make sure you get your test results. This information is not intended to replace advice given to you by your health care provider. Make sure you discuss any questions you have with your health care provider. Document Revised: 04/02/2018 Document Reviewed: 04/02/2018 Elsevier Patient Education  2020 [employer].   Exercising to Stay Healthy To become healthy and stay healthy, it is recommended that you do moderate-intensity and vigorous-intensity exercise. You can tell that you are exercising at a moderate intensity if your heart starts beating faster and you start breathing faster but can still hold a conversation. You can tell that you are exercising at a vigorous intensity if you are breathing much harder and faster and cannot hold a conversation while exercising. Exercising regularly is important. It has many health benefits, such as:  Improving overall fitness, flexibility, and endurance.  Increasing bone density.  Helping with weight control.  Decreasing body fat.  Increasing muscle strength.  Reducing stress and tension.  Improving overall health. How often should I exercise? Choose an activity that you enjoy, and set realistic goals. Your health care provider can help you make an activity plan that works for you. Exercise regularly as told by your health care provider. This may include:  Doing strength training two times a week, such as: ? Lifting weights. ? Using resistance bands. ? Push-ups. ? Sit-ups. ? Yoga.  Doing a certain intensity of exercise for a given amount of time. Choose from these options: ? A total of 150 minutes of moderate-intensity exercise every week. ? A total of 75 minutes of vigorous-intensity exercise every week. ? A mix of moderate-intensity and  vigorous-intensity exercise every week. Children, pregnant women, people who have not exercised regularly, people who are overweight, and older adults may need to talk with a health care provider about what activities are safe to do. If you have a medical condition, be sure to talk with your health care provider before you start a new exercise program. What are some exercise ideas? Moderate-intensity exercise ideas include:  Walking 1 mile (1.6 km) in about 15 minutes.  Biking.  Hiking.  Golfing.  Dancing.  Water aerobics. Vigorous-intensity exercise ideas include:  Walking 4.5 miles (7.2 km) or more in about 1 hour.  Jogging or running 5 miles (8 km) in about 1 hour.  Biking 10 miles (16.1 km) or more in about 1 hour.  Lap swimming.  Roller-skating or in-line skating.  Cross-country skiing.  Vigorous competitive sports, such as football, basketball, and soccer.  Jumping rope.  Aerobic dancing. What are some everyday activities that can help me to get exercise?  [place] work, such as: ? Pushing a Conservation officer, nature. ? Raking and bagging leaves.  Washing your car.  Pushing a stroller.  Shoveling snow.  Gardening.  Washing windows or floors. How can I be more active in my day-to-day activities?  Use stairs instead of an elevator.  Take a walk during your lunch break.  If you drive, park your car farther away from your work or school.  If you take public transportation, get off one stop early and walk the rest of the way.  Stand up or walk around during all of your indoor phone calls.  Get up, stretch, and walk around every 30 minutes throughout the day.  Enjoy exercise with a friend. Support to continue exercising will help you keep a regular routine of activity. What guidelines can I follow while exercising?  Before you start a new exercise program, talk with your health care provider.  Do not exercise so much that you hurt yourself, feel dizzy, or get very  short of breath.  Wear comfortable clothes and wear shoes with good support.  Drink plenty of water while you exercise to prevent dehydration or heat stroke.  Work out until your breathing and your heartbeat get faster. Where to find more information  U.S. Department of Health and Human Services: [employer]ed[employer].at  Centers for Disease Control and Prevention (CDC): http://www.wolf.info/ Summary  Exercising regularly is important. It will improve your overall fitness, flexibility, and endurance.  Regular exercise also will improve your overall health. It can help you control your weight, reduce stress, and improve your bone density.  Do not exercise so much that you hurt yourself, feel dizzy, or get very short of breath.  Before you start a new exercise program, talk with your health care provider. This information is not intended to replace advice given to you by your health care provider. Make sure you discuss any questions you have with your health care provider. Document Revised: 07/25/2017 Document Reviewed: 07/03/2017 Elsevier Patient Education  [place].   Budget-Friendly Healthy Eating There are many ways to save money at the grocery store and continue to eat healthy. You can be successful if you:  Plan meals according to your budget.  Make a grocery list and only purchase food according to your grocery list.  Prepare food yourself. What are tips for following this plan?  Reading food labels  Compare food labels between brand name foods and the store brand. Often the nutritional value is the same, but the store brand is lower cost.  Look for products that do not have added sugar, fat, or salt (sodium). These often cost the same but are healthier for you. Products may be labeled as: ? Sugar-free. ? Nonfat. ? Low-fat. ? Sodium-free. ? Low-sodium.  Look for lean ground beef labeled as at least 92% lean and 8% fat. Shopping  Buy only the items on your grocery list and go  only to the areas of the store that have the items on your list.  Use coupons only for foods and brands you normally buy. Avoid buying items you wouldn't normally buy simply because they are on sale.  Check online and in newspapers for weekly deals.  Buy healthy items from the bulk bins when available, such as herbs, spices, flour, pasta, nuts, and dried fruit.  Buy fruits and vegetables that are in season. Prices are usually lower on in-season produce.  Look at the unit price on the price tag. Use it to compare different brands and sizes to find out which item is the best deal.  Choose healthy items that are often low-cost, such as carrots, potatoes, apples, bananas, and oranges. Dried or  canned beans are a low-cost protein source.  Buy in bulk and freeze extra food. Items you can buy in bulk include meats, fish, poultry, frozen fruits, and frozen vegetables.  Avoid buying "ready-to-eat" foods, such as pre-cut fruits and vegetables and pre-made salads.  If possible, shop around to discover where you can find the best prices. Consider other retailers such as dollar stores, larger [employer], local fruit and vegetable stands, and farmers markets.  Do not shop when you are hungry. If you shop while hungry, it may be hard to stick to your list and budget.  Resist impulse buying. Use your grocery list as your official plan for the week.  Buy a variety of vegetables and fruits by purchasing fresh, frozen, and canned items.  Look at the top and bottom shelves for deals. Foods at eye level (eye level of an adult or child) are usually more expensive.  Be efficient with your time when shopping. The more time you spend at the store, the more money you are likely to spend.  To save money when choosing more expensive foods like meats and dairy: ? Choose cheaper cuts of meat, such as bone-in chicken thighs and drumsticks instead of skinless and boneless chicken. When you are ready to prepare  the chicken, you can remove the skin yourself to make it healthier. ? Choose lean meats like chicken or [place] instead of beef. ? Choose canned seafood, such as tuna, salmon, or sardines. ? Buy eggs as a low-cost source of protein. ? Buy dried beans and peas, such as lentils, split peas, or kidney beans instead of meats. Dried beans and peas are a good alternative source of protein. ? Buy the larger tubs of yogurt instead of individual-sized containers.  Choose water instead of sodas and other sweetened beverages.  Avoid buying chips, cookies, and other "junk food." These items are usually expensive and not healthy. Cooking  Make extra food and freeze the extras in meal-sized containers or in individual portions for fast meals and snacks.  Pre-cook on days when you have extra time to prepare meals in advance. You can keep these meals in the fridge or freezer and reheat for a quick meal.  When you come home from the grocery store, wash, peel, and cut fruits and vegetables so they are ready to use and eat. This will help reduce food waste. Meal planning  Do not eat out or get fast food. Prepare food at home.  Make a grocery list and make sure to bring it with you to the store. If you have a smart phone, you could use your phone to create your shopping list.  Plan meals and snacks according to a grocery list and budget you create.  Use leftovers in your meal plan for the week.  Look for recipes where you can cook once and make enough food for two meals.  Include budget-friendly meals like stews, casseroles, and stir-fry dishes.  Try some meatless meals or try "no cook" meals like salads.  Make sure that half your plate is filled with fruits or vegetables. Choose from fresh, frozen, or canned fruits and vegetables. If eating canned, remember to rinse them before eating. This will remove any excess salt added for packaging. Summary  Eating healthy on a budget is possible if you plan  your meals according to your budget, purchase according to your budget and grocery list, and prepare food yourself.  Tips for buying more food on a limited budget include buying generic  brands, using coupons only for foods you normally buy, and buying healthy items from the bulk bins when available.  Tips for buying cheaper food to replace expensive food include choosing cheaper, lean cuts of meat, and buying dried beans and peas. This information is not intended to replace advice given to you by your health care provider. Make sure you discuss any questions you have with your health care provider. Document Revised: 08/13/2017 Document Reviewed: 08/13/2017 Elsevier Patient Education  2020 [place] protect organs, store calcium, anchor muscles, and support the whole body. Keeping your bones strong is important, especially as you get older. You can take actions to help keep your bones strong and healthy. Why is keeping my bones healthy important?  Keeping your bones healthy is important because your body constantly replaces bone cells. Cells get old, and new cells take their place. As we age, we lose bone cells because the body may not be able to make enough new cells to replace the old cells. The amount of bone cells and bone tissue you have is referred to as bone mass. The higher your bone mass, the stronger your bones. The aging process leads to an overall loss of bone mass in the body, which can increase the likelihood of:  Joint pain and stiffness.  Broken bones.  A condition in which the bones become weak and brittle (osteoporosis). A large decline in bone mass occurs in older adults. In women, it occurs about the time of menopause. What actions can I take to keep my bones healthy? Good health habits are important for maintaining healthy bones. This includes eating nutritious foods and exercising regularly. To have healthy bones, you need to get enough of the  right minerals and vitamins. Most nutrition experts recommend getting these nutrients from the foods that you eat. In some cases, taking supplements may also be recommended. Doing certain types of exercise is also important for bone health. What are the nutritional recommendations for healthy bones?  Eating a well-balanced diet with plenty of calcium and vitamin D will help to protect your bones. Nutritional recommendations vary from person to person. Ask your health care provider what is healthy for you. Here are some general guidelines. Get enough calcium Calcium is the most important (essential) mineral for bone health. Most people can get enough calcium from their diet, but supplements may be recommended for people who are at risk for osteoporosis. Good sources of calcium include:  Dairy products, such as low-fat or nonfat milk, cheese, and yogurt.  Dark green leafy vegetables, such as bok choy and broccoli.  Calcium-fortified foods, such as orange juice, cereal, bread, soy beverages, and tofu products.  Nuts, such as almonds. Follow these recommended amounts for daily calcium intake:  Children, age 73-3: 700 mg.  Children, age 47-8: 1,000 mg.  Children, age 35-13: 1,300 mg.  Teens, age 60-18: 1,300 mg.  Adults, age 73-50: 1,000 mg.  Adults, age 70-70: ? Men: 1,000 mg. ? Women: 1,200 mg.  Adults, age 37 or older: 1,200 mg.  Pregnant and breastfeeding females: ? Teens: 1,300 mg. ? Adults: 1,000 mg. Get enough vitamin D Vitamin D is the most essential vitamin for bone health. It helps the body absorb calcium. Sunlight stimulates the skin to make vitamin D, so be sure to get enough sunlight. If you live in a cold climate or you do not get outside often, your health care provider may recommend that you take vitamin D supplements. Good  sources of vitamin D in your diet include:  Egg yolks.  Saltwater fish.  Milk and cereal fortified with vitamin D. Follow these recommended  amounts for daily vitamin D intake:  Children and teens, age 29-18: 600 international units.  Adults, age 39 or younger: 400-800 international units.  Adults, age 22 or older: 800-1,000 international units. Get other important nutrients Other nutrients that are important for bone health include:  Phosphorus. This mineral is found in meat, poultry, dairy foods, nuts, and legumes. The recommended daily intake for adult men and adult women is 700 mg.  Magnesium. This mineral is found in seeds, nuts, dark green vegetables, and legumes. The recommended daily intake for adult men is 400-420 mg. For adult women, it is 310-320 mg.  Vitamin K. This vitamin is found in green leafy vegetables. The recommended daily intake is 120 mg for adult men and 90 mg for adult women. What type of physical activity is best for building and maintaining healthy bones? Weight-bearing and strength-building activities are important for building and maintaining healthy bones. Weight-bearing activities cause muscles and bones to work against gravity. Strength-building activities increase the strength of the muscles that support bones. Weight-bearing and muscle-building activities include:  Walking and hiking.  Jogging and running.  Dancing.  Gym exercises.  Lifting weights.  Tennis and racquetball.  Climbing stairs.  Aerobics. Adults should get at least 30 minutes of moderate physical activity on most days. Children should get at least 60 minutes of moderate physical activity on most days. Ask your health care provider what type of exercise is best for you. How can I find out if my bone mass is low? Bone mass can be measured with an X-ray test called a bone mineral density (BMD) test. This test is recommended for all women who are age 42 or older. It may also be recommended for:  Men who are age 9 or older.  People who are at risk for osteoporosis because of: ? Having bones that break easily. ? Having a  long-term disease that weakens bones, such as kidney disease or rheumatoid arthritis. ? Having menopause earlier than normal. ? Taking medicine that weakens bones, such as steroids, thyroid hormones, or hormone treatment for breast cancer or prostate cancer. ? Smoking. ? Drinking three or more alcoholic drinks a day. If you find that you have a low bone mass, you may be able to prevent osteoporosis or further bone loss by changing your diet and lifestyle. Where can I find more information? For more information, check out the following websites:  [place]: AviationTales.fr  [employer] of Health: www.bones.SouthExposed.es  International Osteoporosis Foundation: Administrator.iofbonehealth.org Summary  The aging process leads to an overall loss of bone mass in the body, which can increase the likelihood of broken bones and osteoporosis.  Eating a well-balanced diet with plenty of calcium and vitamin D will help to protect your bones.  Weight-bearing and strength-building activities are also important for building and maintaining strong bones.  Bone mass can be measured with an X-ray test called a bone mineral density (BMD) test. This information is not intended to replace advice given to you by your health care provider. Make sure you discuss any questions you have with your health care provider. Document Revised: 09/08/2017 Document Reviewed: 09/08/2017 Elsevier Patient Education  2020 [employer].

## 2020-05-16 LAB — CYTOLOGY - PAP
Chlamydia: NEGATIVE
Comment: NEGATIVE
Comment: NEGATIVE
Comment: NEGATIVE
Comment: NORMAL
Diagnosis: NEGATIVE
High risk HPV: NEGATIVE
Neisseria Gonorrhea: NEGATIVE
Trichomonas: NEGATIVE

## 2020-06-01 ENCOUNTER — Encounter: Payer: Self-pay | Admitting: Obstetrics and Gynecology

## 2020-06-01 ENCOUNTER — Ambulatory Visit (INDEPENDENT_AMBULATORY_CARE_PROVIDER_SITE_OTHER): Payer: No Typology Code available for payment source | Admitting: Obstetrics and Gynecology

## 2020-06-01 ENCOUNTER — Other Ambulatory Visit: Payer: Self-pay

## 2020-06-01 ENCOUNTER — Other Ambulatory Visit: Payer: Self-pay | Admitting: Obstetrics and Gynecology

## 2020-06-01 ENCOUNTER — Ambulatory Visit (INDEPENDENT_AMBULATORY_CARE_PROVIDER_SITE_OTHER): Payer: No Typology Code available for payment source

## 2020-06-01 VITALS — BP 120/72 | Ht 63.0 in | Wt 163.0 lb

## 2020-06-01 DIAGNOSIS — D252 Subserosal leiomyoma of uterus: Secondary | ICD-10-CM

## 2020-06-01 DIAGNOSIS — N920 Excessive and frequent menstruation with regular cycle: Secondary | ICD-10-CM

## 2020-06-01 DIAGNOSIS — D250 Submucous leiomyoma of uterus: Secondary | ICD-10-CM | POA: Diagnosis not present

## 2020-06-01 DIAGNOSIS — D251 Intramural leiomyoma of uterus: Secondary | ICD-10-CM | POA: Diagnosis not present

## 2020-06-01 NOTE — Patient Instructions (Signed)
Hysterectomy Information  A hysterectomy is a surgery in which the uterus is removed. The fallopian tubes and ovaries may be removed (bilateral salpingo-oophorectomy) as well. This procedure may be done to treat various medical problems. After the procedure, a woman will no longer have menstrual periods nor will she be able to become pregnant (sterile). What are the reasons for a hysterectomy? There are many reasons why a woman might have this procedure. They include:  Persistent, abnormal vaginal bleeding.  Long-term (chronic) pelvic pain or infection.  Endometriosis. This is when the lining of the uterus (endometrium) starts to grow outside the uterus.  Adenomyosis. This is when the endometrium starts to grow in the muscle of the uterus.  Pelvic organ prolapse. This is a condition in which the uterus falls down into the vagina.  Noncancerous growths in the uterus (uterine fibroids) that cause symptoms.  The presence of precancerous cells.  Cervical or uterine cancer. What are the different types of hysterectomy? There are three different types of hysterectomy:  Supracervical hysterectomy. In this type, the top part of the uterus is removed, but not the cervix.  Total hysterectomy. In this type, the uterus and cervix are removed.  Radical hysterectomy. In this type, the uterus, the cervix, and the tissue that holds the uterus in place (parametrium) are removed. What are the different ways a hysterectomy can be performed? There are many different ways a hysterectomy can be performed, including:  Abdominal hysterectomy. In this type, an incision is made in the abdomen. The uterus is removed through this incision.  Vaginal hysterectomy. In this type, an incision is made in the vagina. The uterus is removed through this incision. There are no abdominal incisions.  Conventional laparoscopic hysterectomy. In this type, three or four small incisions are made in the abdomen. A thin,  lighted tube with a camera (laparoscope) is inserted into one of the incisions. Other tools are put through the other incisions. The uterus is cut into small pieces. The small pieces are removed through the incisions or through the vagina.  Laparoscopically assisted vaginal hysterectomy (LAVH). In this type, three or four small incisions are made in the abdomen. Part of the surgery is performed laparoscopically and the other part is done vaginally. The uterus is removed through the vagina.  Robot-assisted laparoscopic hysterectomy. In this type, a laparoscope and other tools are inserted into three or four small incisions in the abdomen. A computer-controlled device is used to give the surgeon a 3D image and to help control the surgical instruments. This allows for more precise movements of surgical instruments. The uterus is cut into small pieces and removed through the incisions or removed through the vagina. Discuss the options with your health care provider to determine which type is the right one for you. What are the risks? Generally, this is a safe procedure. However, problems may occur, including:  Bleeding and risk of blood transfusion. Tell your health care provider if you do not want to receive any blood products.  Blood clots in the legs or lung.  Infection.  Damage to other structures or organs.  Allergic reactions to medicines.  Changing to an abdominal hysterectomy from one of the other techniques. What to expect after a hysterectomy  You will be given pain medicine.  You may need to stay in the hospital for 1- 2 days to recover, depending on the type of hysterectomy you had.  Follow your health care provider's instructions about exercise, driving, and general activities. Ask your   health care provider what activities are safe for you.  You will need to have someone with you for the first 3-5 days after you go home.  You will need to follow up with your surgeon in 2-4  weeks after surgery to evaluate your progress.  If the ovaries are removed, you will have early menopause symptoms such as hot flashes, night sweats, and insomnia.  If you had a hysterectomy for a problem that was not cancer or not a condition that could lead to cancer, then you no longer need Pap tests. However, even if you no longer need a Pap test, a regular pelvic exam is a good idea to make sure no other problems are developing. Questions to ask your health care provider  Is a hysterectomy medically necessary? Do I have other treatment options for my condition?  What are my options for hysterectomy procedure?  What organs and tissues need to be removed?  What are the risks?  What are the benefits?  How long will I need to stay in the hospital after the procedure?  How long will I need to recover at home?  What symptoms can I expect after the procedure? Summary  A hysterectomy is a surgery in which the uterus is removed. The fallopian tubes and ovaries may be removed (bilateral salpingo-oophorectomy) as well.  This procedure may be done to treat various medical problems. After the procedure, a woman will no longer have menstrual periods nor will she be able to become pregnant.  Discuss the options with your health care provider to determine which type of hysterectomy is the right one for you. This information is not intended to replace advice given to you by your health care provider. Make sure you discuss any questions you have with your health care provider. Document Revised: 07/25/2017 Document Reviewed: 09/18/2016 Elsevier Patient Education  2020 Elsevier Inc.  

## 2020-06-01 NOTE — Progress Notes (Signed)
Pt has decline her Flu shot. Pt had an U/S today.

## 2020-06-01 NOTE — Progress Notes (Signed)
Patient ID: Julie Ochoa, female   DOB: 15-Jun-1981, 39 y.o.   MRN: [id number]  Reason for Consult: Gynecologic Exam   Referred by No ref. provider found  Subjective:     HPI:  Julie Ochoa is a 39 y.o. female. She is following up today for menorrhagia with history of uterine fibroids for a pelvic US.   History reviewed. No pertinent past medical history. History reviewed. No pertinent family history. History reviewed. No pertinent surgical history.  Short Social History:  Social History   Tobacco Use  . Smoking status: Never Smoker  . Smokeless tobacco: Never Used  Substance Use Topics  . Alcohol use: No    Allergies  Allergen Reactions  . Amoxicillin Rash  . Penicillins Rash    Did it involve swelling of the face/tongue/throat, SOB, or low BP? no Did it involve sudden or severe rash/hives, skin peeling, or any reaction on the inside of your mouth or nose? yes Did you need to seek medical attention at a hospital or doctor's office? no When did it last happen?2000 If all above answers are "NO", may proceed with cephalosporin use.     Current Outpatient Medications  Medication Sig Dispense Refill  . albuterol (PROVENTIL HFA;VENTOLIN HFA) 108 (90 BASE) MCG/ACT inhaler Inhale 1-2 puffs into the lungs every 6 (six) hours as needed for wheezing or shortness of breath. 1 Inhaler 0  . azithromycin (ZITHROMAX Z-PAK) 250 MG tablet 2 po day one, then 1 daily x 4 days 5 tablet 0  . Ferrous Sulfate (IRON PO) Take 1 tablet by mouth daily.    Marland Kitchen FLUoxetine (PROZAC) 10 MG capsule Take 1 capsule (10 mg total) by mouth daily. 30 capsule 11  . hydrocortisone 2.5 % cream Apply to affected areas on face twice daily as needed.    Marland Kitchen ibuprofen (ADVIL,MOTRIN) 600 MG tablet Take 1 tablet (600 mg total) by mouth every 6 (six) hours as needed. 30 tablet 0  . nitrofurantoin, macrocrystal-monohydrate, (MACROBID) 100 MG capsule Take 1 capsule (100 mg total) by mouth 2 (two) times  daily. 14 capsule 0  . phenazopyridine (PYRIDIUM) 200 MG tablet Take 1 tablet (200 mg total) by mouth 3 (three) times daily. 6 tablet 0   No current facility-administered medications for this visit.    Review of Systems  Constitutional: Negative for chills, fatigue, fever and unexpected weight change.  HENT: Negative for trouble swallowing.  Eyes: Negative for loss of vision.  Respiratory: Negative for cough, shortness of breath and wheezing.  Cardiovascular: Negative for chest pain, leg swelling, palpitations and syncope.  GI: Negative for abdominal pain, blood in stool, diarrhea, nausea and vomiting.  GU: Negative for difficulty urinating, dysuria, frequency and hematuria.  Musculoskeletal: Negative for back pain, leg pain and joint pain.  Skin: Negative for rash.  Neurological: Negative for dizziness, headaches, light-headedness, numbness and seizures.  Psychiatric: Negative for behavioral problem, confusion, depressed mood and sleep disturbance.        Objective:  Objective   Vitals:   06/01/20 1038  BP: 120/72  Weight: 163 lb (73.9 kg)  Height: 5\' 3"  (1.6 m)   Body mass index is 28.87 kg/m.  Physical Exam Vitals and nursing note reviewed.  Constitutional:      Appearance: She is well-developed.  HENT:     Head: Normocephalic and atraumatic.  Eyes:     Pupils: Pupils are equal, round, and reactive to light.  Cardiovascular:     Rate and Rhythm: Normal rate and  regular rhythm.  Pulmonary:     Effort: Pulmonary effort is normal. No respiratory distress.  Skin:    General: Skin is warm and dry.  Neurological:     Mental Status: She is alert and oriented to person, place, and time.  Psychiatric:        Behavior: Behavior normal.        Thought Content: Thought content normal.        Judgment: Judgment normal.        Assessment/Plan:     39 y.o. with complaints of abnormal uterine bleeding secondary to uterine fibroids.   We discussed options for management  of uterine fibroids.  We discussed that this can include hormonal medications such as an oral contraceptive pill, progesterone only pill, Depo-Provera, Nexplanon,  progesterone IUD.  We discussed surgical options including hysteroscopy with myomectomy for small submucosal fibroids, abdominal or laparoscopic myomectomy, uterine fibroid embolization, and hysterectomy.  We discussed the pros and cons of these different procedures and medical management options in detail.   She is considering hysterectomy She will let us know how she desires to proceed  More than 20 minutes were spent face to face with the patient in the room, reviewing the medical record, labs and images, and coordinating care for the patient. The plan of management was discussed in detail and counseling was provided.      Adrian Prows MD [facility], [facility] 06/01/2020 11:23 AM

## 2020-06-06 ENCOUNTER — Other Ambulatory Visit: Payer: Self-pay | Admitting: Obstetrics and Gynecology

## 2020-06-06 DIAGNOSIS — F3281 Premenstrual dysphoric disorder: Secondary | ICD-10-CM

## 2020-06-09 ENCOUNTER — Other Ambulatory Visit: Payer: Self-pay | Admitting: Obstetrics and Gynecology

## 2020-06-09 DIAGNOSIS — F3281 Premenstrual dysphoric disorder: Secondary | ICD-10-CM

## 2020-08-04 ENCOUNTER — Telehealth: Payer: Self-pay

## 2020-08-04 NOTE — Telephone Encounter (Signed)
Pt calling; saw CRS earlier this year; discussed hyst; is it possible to have it done before the end of the year?  [phone]

## 2020-08-15 ENCOUNTER — Telehealth: Payer: Self-pay | Admitting: Obstetrics and Gynecology

## 2020-08-15 NOTE — Telephone Encounter (Signed)
I called pt to schedule surgery with [facility]. She was hoping to have it done this year since her deductible has been met. I adv that, unfortunately, it would not be possible to do that as we are booked on OR days. Once I adv of this she said that she would like to wait a little while before scheduling. I adv that I could call her in March and see if she was ready to schedule at that time. She agreed to it.

## 2020-09-29 ENCOUNTER — Encounter: Payer: Self-pay | Admitting: Obstetrics & Gynecology

## 2020-10-03 ENCOUNTER — Other Ambulatory Visit: Payer: Self-pay | Admitting: Obstetrics & Gynecology

## 2020-10-04 ENCOUNTER — Telehealth: Payer: Self-pay

## 2020-10-04 NOTE — Telephone Encounter (Signed)
-----   Message from Homero Fellers, MD sent at 08/05/2020 11:26 AM EST ----- Surgery Booking Request Patient Full Name:  Julie Ochoa  MRN: [id number]  DOB: 19-Nov-1980  Surgeon: Homero Fellers, MD  Requested Surgery Date and Time: patient would like before then end of the year, not sure if this is possible. May be a robotic candidate though Primary Diagnosis AND Code: fibroid uterus Secondary Diagnosis and Code:  Surgical Procedure:  Total laparoscopic hysterectomy verus robotic assisted hysterectomy with bilateral salpingectomy  RNFA Requested?: No L&D Notification: No Admission Status: same day surgery Length of Surgery: 150 min Special Case Needs: No H&P: Yes Phone Interview???:  Yes Interpreter: No Medical Clearance:  No Special Scheduling Instructions: No Any known health/anesthesia issues, diabetes, sleep apnea, latex allergy, defibrillator/pacemaker?: No Acuity: P3   (P1 highest, P2 delay may cause harm, P3 low, elective gyn, P4 lowest)

## 2020-10-04 NOTE — Telephone Encounter (Signed)
I called pt to schedule surgery with [facility]. She was hoping to have it done this year since her deductible has been met. I adv that, unfortunately, it would not be possible to do that as we are booked on OR days. Once I adv of this she said that she would like to wait a little while before scheduling. I adv that I could call her in March and see if she was ready to schedule at that time. She agreed to it.

## 2020-11-09 ENCOUNTER — Telehealth: Payer: Self-pay

## 2020-11-09 NOTE — Telephone Encounter (Signed)
Left msg asking if pt is ready to schedule Xi TLH/BS with [facility]. I told her to call if she is ready.

## 2021-02-13 ENCOUNTER — Encounter: Payer: Self-pay | Admitting: Obstetrics and Gynecology

## 2021-02-13 ENCOUNTER — Other Ambulatory Visit: Payer: Self-pay

## 2021-02-13 ENCOUNTER — Ambulatory Visit: Payer: No Typology Code available for payment source | Admitting: Obstetrics and Gynecology

## 2021-02-13 VITALS — BP 120/80 | Ht 64.0 in | Wt 168.0 lb

## 2021-02-13 DIAGNOSIS — Z8041 Family history of malignant neoplasm of ovary: Secondary | ICD-10-CM | POA: Diagnosis not present

## 2021-02-13 DIAGNOSIS — N649 Disorder of breast, unspecified: Secondary | ICD-10-CM | POA: Diagnosis not present

## 2021-02-13 MED ORDER — DOXYCYCLINE HYCLATE 100 MG PO CAPS: 100.0000 mg | ORAL_CAPSULE | Freq: Two times a day (BID) | ORAL | 0 refills | Status: AC

## 2021-02-13 NOTE — Progress Notes (Signed)
Patient, No Pcp Per (Inactive)   Chief Complaint  Patient presents with   Breast Exam    Lump on RB since Thursday, tender    HPI:      [name] is a 40 y.o. No obstetric history on file. whose LMP was Patient's last menstrual period was 02/06/2021 (exact date)., presents today for RT breast mass/lesion for past 6 days. Was softer and more flesh-colored initially, but now harder and red. Tenderness is about the same. No d/c, no nipple d/c, no hx of trauma. No known bites. No fevers, no hx of breast masses. No FH breast cancer, but FH ovarian cancer in her MGM and stomach cancer in her mat aunt (seen after pt gone).  Neg mammo 09/29/20 at [facility] Was recently on amox for root canal, finished yesterday.   Past Medical History:  Diagnosis Date   Anxiety    Depression     Past Surgical History:  Procedure Laterality Date   OTHER SURGICAL HISTORY     Tubal Reversal   TUBAL LIGATION      Family History  Problem Relation Age of Onset   Leukemia Sister    Stomach cancer Maternal Aunt    Ovarian cancer Maternal Grandmother        not sure age   Prostate cancer Paternal Grandfather     Social History   Socioeconomic History   Marital status: Married    Spouse name: Not on file   Number of children: Not on file   Years of education: Not on file   Highest education level: Not on file  Occupational History   Not on file  Tobacco Use   Smoking status: Never   Smokeless tobacco: Never  Vaping Use   Vaping Use: Never used  Substance and Sexual Activity   Alcohol use: No   Drug use: No   Sexual activity: Yes    Birth control/protection: None  Other Topics Concern   Not on file  Social History Narrative   Not on file   Social Determinants of Health   Financial Resource Strain: Not on file  Food Insecurity: Not on file  Transportation Needs: Not on file  Physical Activity: Not on file  Stress: Not on file  Social Connections: Not on file  Intimate  Partner Violence: Not on file    Outpatient Medications Prior to Visit  Medication Sig Dispense Refill   Ferrous Sulfate (IRON PO) Take 1 tablet by mouth daily.     FLUoxetine (PROZAC) 10 MG capsule TAKE 1 CAPSULE BY MOUTH EVERY DAY 90 capsule 4   hydrocortisone 2.5 % cream Apply to affected areas on face twice daily as needed.     ibuprofen (ADVIL,MOTRIN) 600 MG tablet Take 1 tablet (600 mg total) by mouth every 6 (six) hours as needed. 30 tablet 0   albuterol (PROVENTIL HFA;VENTOLIN HFA) 108 (90 BASE) MCG/ACT inhaler Inhale 1-2 puffs into the lungs every 6 (six) hours as needed for wheezing or shortness of breath. 1 Inhaler 0   azithromycin (ZITHROMAX Z-PAK) 250 MG tablet 2 po day one, then 1 daily x 4 days 5 tablet 0   nitrofurantoin, macrocrystal-monohydrate, (MACROBID) 100 MG capsule Take 1 capsule (100 mg total) by mouth 2 (two) times daily. 14 capsule 0   phenazopyridine (PYRIDIUM) 200 MG tablet Take 1 tablet (200 mg total) by mouth 3 (three) times daily. 6 tablet 0   No facility-administered medications prior to visit.  ROS:  Review of Systems  Constitutional:  Negative for fever.  Gastrointestinal:  Negative for blood in stool, constipation, diarrhea, nausea and vomiting.  Genitourinary:  Negative for dyspareunia, dysuria, flank pain, frequency, hematuria, urgency, vaginal bleeding, vaginal discharge and vaginal pain.  Musculoskeletal:  Negative for back pain.  Skin:  Negative for rash.  BREAST: pain/mass   OBJECTIVE:   Vitals:  BP 120/80   Ht 5\' 4"  (1.626 m)   Wt 168 lb (76.2 kg)   LMP 02/06/2021 (Exact Date)   BMI 28.84 kg/m   Physical Exam Vitals reviewed.  Pulmonary:     Effort: Pulmonary effort is normal.  Chest:  Breasts:    Breasts are symmetrical.     Right: No inverted nipple, mass, nipple discharge, skin change or tenderness.     Left: No inverted nipple, mass, nipple discharge, skin change or tenderness.    Musculoskeletal:        General:  Normal range of motion.     Cervical back: Normal range of motion.  Skin:    General: Skin is warm and dry.  Neurological:     General: No focal deficit present.     Mental Status: She is alert and oriented to person, place, and time.     Cranial Nerves: No cranial nerve deficit.  Psychiatric:        Mood and Affect: Mood normal.        Behavior: Behavior normal.        Thought Content: Thought content normal.        Judgment: Judgment normal.    Assessment/Plan: Breast lesion - Plan: doxycycline (VIBRAMYCIN) 100 MG capsule; RT breast, question abscess vs infected EIC. Treat with doxy, warm compresses. RTO in 2 wks for recheck. F/u sooner prn.   Family history of ovarian cancer--will discuss MyRisk testing at f/u appt.    Meds ordered this encounter  Medications   doxycycline (VIBRAMYCIN) 100 MG capsule    Sig: Take 1 capsule (100 mg total) by mouth 2 (two) times daily for 14 days.    Dispense:  28 capsule    Refill:  0    Order Specific Question:   Supervising Provider    Answer:   Gae Dry [[id number]]      Return in about 2 weeks (around 02/27/2021) for breast f/u.  Romelle Muldoon B. Kalik Hoare, PA-C 02/13/2021 5:02 PM

## 2021-03-12 ENCOUNTER — Other Ambulatory Visit: Payer: Self-pay | Admitting: Obstetrics and Gynecology

## 2021-03-12 DIAGNOSIS — F3281 Premenstrual dysphoric disorder: Secondary | ICD-10-CM

## 2021-07-06 ENCOUNTER — Ambulatory Visit: Payer: No Typology Code available for payment source | Admitting: Obstetrics and Gynecology

## 2021-08-06 ENCOUNTER — Telehealth: Payer: Self-pay

## 2021-08-06 NOTE — Telephone Encounter (Signed)
I don't usually Rx hormones for this if not already on [facility]. You saw her for last annual, I just saw her for breast mass. Do you?

## 2021-08-06 NOTE — Telephone Encounter (Signed)
Pt calling; is there a pill to delay a period or anything to do?  Is going out of town for Christmas and is due to have cycle while gone.  [phone]

## 2021-08-06 NOTE — Telephone Encounter (Signed)
no

## 2021-08-06 NOTE — Telephone Encounter (Signed)
Left detailed msg that we are not able to help her with this d/t she is not on bc already.

## 2021-08-06 NOTE — Telephone Encounter (Signed)
We don't start hormones to stop a period for someone not on hormones. Sorry.

## 2022-01-13 NOTE — Progress Notes (Signed)
Patient, No Pcp Per (Inactive)   Chief Complaint  Patient presents with   Pelvic Pain    Left side only   Menorrhagia    Heavy cycles, severe cramping    HPI:      [name] is a 41 y.o. [id number] whose LMP was Patient's last menstrual period was 01/05/2022 (exact date)., presents today for menometrorrhagia. Hx of large leio 10/21 with discussion of hyst with Dr. Gilman Schmidt at that time. Didn't do surg due to recovery time and cost. Would like hyst now. Menses are monthly, lasting 5 days real flow, 10 days total with spotting at end of cycle, heavy flow changing products Q45 min for 3 days, with 1/2 dollar sized clots, severe dysmen, doesn't take meds. No urin or GI sx. Had CBC with PCP 2/23 with H/H=11.2/34.2, taking Fe supp.  She is sex active, no pain/bleeding.  S/p TL.  Neg pap/neg HPV DNA 9/21  10/21 GYN u/s results: Fibroid 1: 70.8 x 61.3 x 65.6 mm Posterior subserosal, lower uterine segment Fibroid 2:43.2 x 26.5 x 37.9 mm Subserosal fundal Fibroid 3: 19.0 x 13.7 x 17.2 mm Intramural left Fibroid 4: 21.7 x 13.9 x 20.5 mm Intramural left vs. Submucosal Fibroid 5: 29.1 x 21.7 x 29.3 mm Intramural left vs. Partially submucosal Fibroid 6: 29.9 x 21.0 x 19.9 mm Subserosal left Fibroid 7: 14.9 x 14.9 x 16.8 mm. Intramural left Fibroid 8: 16.0 x 9.0 x 19.9 mm Intramural left Fibroid 9: 31.1 x 22.2 x 25.4 mm. Posterior subserosal right  There is a FH of ovarian cancer in her MGM and stomach cancer in her mat aunt, genetic testing not done.  Pt due for mammo.  Patient Active Problem List   Diagnosis Date Noted   Menometrorrhagia 01/14/2022   Leiomyoma 01/14/2022   Family history of ovarian cancer 02/13/2021   Abnormal cervical Papanicolaou smear 03/16/2019   Anemia 03/16/2019   Enlarged uterus 03/16/2019   Mixed anxiety and depressive disorder 03/16/2019   Suicidal ideation 08/07/2018   Major depressive disorder, recurrent episode ([place]) 08/06/2018    Past  Surgical History:  Procedure Laterality Date   OTHER SURGICAL HISTORY     Tubal Reversal   TUBAL LIGATION      Family History  Problem Relation Age of Onset   Leukemia Sister    Stomach cancer Maternal Aunt    Ovarian cancer Maternal Grandmother        60s   Prostate cancer Paternal Grandfather     Social History   Socioeconomic History   Marital status: Married    Spouse name: Not on file   Number of children: Not on file   Years of education: Not on file   Highest education level: Not on file  Occupational History   Not on file  Tobacco Use   Smoking status: Never   Smokeless tobacco: Never  Vaping Use   Vaping Use: Never used  Substance and Sexual Activity   Alcohol use: No   Drug use: No   Sexual activity: Yes    Birth control/protection: Surgical    Comment: Tubal Ligation  Other Topics Concern   Not on file  Social History Narrative   Not on file   Social Determinants of Health   Financial Resource Strain: Not on file  Food Insecurity: Not on file  Transportation Needs: Not on file  Physical Activity: Not on file  Stress: Not on file  Social Connections: Not on file  Intimate Partner Violence: Not on file    Outpatient Medications Prior to Visit  Medication Sig Dispense Refill   Ferrous Sulfate (IRON PO) Take 1 tablet by mouth daily.     FLUoxetine (PROZAC) 10 MG capsule TAKE 1 CAPSULE BY MOUTH EVERY DAY 90 capsule 3   hydrocortisone 2.5 % cream Apply to affected areas on face twice daily as needed.     ibuprofen (ADVIL,MOTRIN) 600 MG tablet Take 1 tablet (600 mg total) by mouth every 6 (six) hours as needed. 30 tablet 0   No facility-administered medications prior to visit.    ROS:  Review of Systems  Constitutional:  Negative for fever.  Gastrointestinal:  Negative for blood in stool, constipation, diarrhea, nausea and vomiting.  Genitourinary:  Positive for menstrual problem and pelvic pain. Negative for dyspareunia, dysuria, flank pain,  frequency, hematuria, urgency, vaginal bleeding, vaginal discharge and vaginal pain.  Musculoskeletal:  Negative for back pain.  Skin:  Negative for rash.  BREAST: No symptoms   OBJECTIVE:   Vitals:  BP 90/64   Ht '5\' 3"'$  (1.6 m)   Wt 163 lb (73.9 kg)   LMP 01/05/2022 (Exact Date)   BMI 28.87 kg/m   Physical Exam Vitals reviewed.  Constitutional:      Appearance: She is well-developed.  Pulmonary:     Effort: Pulmonary effort is normal.  Abdominal:     Palpations: Abdomen is soft. There is mass.     Tenderness: There is no abdominal tenderness.    Genitourinary:    General: Normal vulva.     Pubic Area: No rash.      Labia:        Right: No rash, tenderness or lesion.        Left: No rash, tenderness or lesion.      Vagina: Normal. No vaginal discharge, erythema or tenderness.     Cervix: Normal.     Uterus: Enlarged. Not tender.      Adnexa: Right adnexa normal and left adnexa normal.       Right: No mass or tenderness.         Left: No mass or tenderness.    Musculoskeletal:        General: Normal range of motion.     Cervical back: Normal range of motion.  Skin:    General: Skin is warm and dry.  Neurological:     General: No focal deficit present.     Mental Status: She is alert and oriented to person, place, and time.  Psychiatric:        Mood and Affect: Mood normal.        Behavior: Behavior normal.        Thought Content: Thought content normal.        Judgment: Judgment normal.    Assessment/Plan: Menometrorrhagia - Plan: US PELVIS TRANSVAGINAL NON-OB (TV ONLY); enlarged uterus due to leio. Check GYN u/s to update size of leio; refer to MD for hyst consult/ mgmt. Discussed shrinking leio with lupron vs [place] may be needed before hyst due to size of uterus. Cont Fe supp.   Leiomyoma - Plan: US PELVIS TRANSVAGINAL NON-OB (TV ONLY)  Family history of ovarian cancer--MyRisk testing discussed, pt to f/u if desires. Would be beneficial info to have before  hyst in case pt has mutation for ovar cancer.   Encounter for screening mammogram for malignant neoplasm of breast - Plan: MM 3D SCREEN BREAST BILATERAL; pt to schedule mammo    Return  for GYN u/s for leio/menometrorrhagia, ABC to call pt; MD appt after u/s for hyst consult.  Altheria Shadoan B. Garvey [place]cott, PA-C 01/14/2022 10:19 AM

## 2022-01-14 ENCOUNTER — Ambulatory Visit: Payer: No Typology Code available for payment source | Admitting: Obstetrics and Gynecology

## 2022-01-14 ENCOUNTER — Encounter: Payer: Self-pay | Admitting: Obstetrics and Gynecology

## 2022-01-14 VITALS — BP 90/64 | Ht 63.0 in | Wt 163.0 lb

## 2022-01-14 DIAGNOSIS — N921 Excessive and frequent menstruation with irregular cycle: Secondary | ICD-10-CM | POA: Diagnosis not present

## 2022-01-14 DIAGNOSIS — Z8041 Family history of malignant neoplasm of ovary: Secondary | ICD-10-CM

## 2022-01-14 DIAGNOSIS — Z1231 Encounter for screening mammogram for malignant neoplasm of breast: Secondary | ICD-10-CM

## 2022-01-14 DIAGNOSIS — D219 Benign neoplasm of connective and other soft tissue, unspecified: Secondary | ICD-10-CM

## 2022-01-14 DIAGNOSIS — Z01419 Encounter for gynecological examination (general) (routine) without abnormal findings: Secondary | ICD-10-CM

## 2022-01-14 NOTE — Patient Instructions (Signed)
I value your feedback and you entrusting us with your care. If you get a [facility] patient survey, I would appreciate you taking the time to let us know about your experience today. Thank you! ? ? ?

## 2022-01-24 ENCOUNTER — Ambulatory Visit (INDEPENDENT_AMBULATORY_CARE_PROVIDER_SITE_OTHER): Payer: No Typology Code available for payment source

## 2022-01-24 DIAGNOSIS — R102 Pelvic and perineal pain: Secondary | ICD-10-CM

## 2022-01-24 DIAGNOSIS — D219 Benign neoplasm of connective and other soft tissue, unspecified: Secondary | ICD-10-CM | POA: Diagnosis not present

## 2022-01-28 ENCOUNTER — Ambulatory Visit
Admission: RE | Admit: 2022-01-28 | Discharge: 2022-01-28 | Disposition: A | Discharge status: Home / Self Care | Payer: No Typology Code available for payment source | Source: Ambulatory Visit | Attending: Obstetrics and Gynecology | Admitting: Obstetrics and Gynecology

## 2022-01-28 DIAGNOSIS — Z1231 Encounter for screening mammogram for malignant neoplasm of breast: Secondary | ICD-10-CM | POA: Insufficient documentation

## 2022-01-29 ENCOUNTER — Inpatient Hospital Stay
Admission: RE | Admit: 2022-01-29 | Discharge: 2022-01-29 | Disposition: A | Discharge status: Home / Self Care | Payer: Self-pay | Source: Ambulatory Visit | Attending: *Deleted | Admitting: *Deleted

## 2022-01-29 ENCOUNTER — Telehealth: Payer: Self-pay | Admitting: Licensed Practical Nurse

## 2022-01-29 ENCOUNTER — Other Ambulatory Visit: Payer: Self-pay | Admitting: *Deleted

## 2022-01-29 ENCOUNTER — Telehealth: Payer: Self-pay

## 2022-01-29 DIAGNOSIS — Z1231 Encounter for screening mammogram for malignant neoplasm of breast: Secondary | ICD-10-CM

## 2022-01-29 NOTE — Telephone Encounter (Signed)
Called and left voicemail for patient to call back to be scheduled with MD for endometrial biopsy.

## 2022-01-29 NOTE — Telephone Encounter (Signed)
Patient is scheduled for 02/07/22 with [facility]

## 2022-01-29 NOTE — Telephone Encounter (Signed)
Pt called to reviewed results of recent US, Alicia out of town so unable to talk with pt. Your uterine lining is 41cm, when it is this thick often an endometrial biopsy is recommended.  This can easily be done in the office. Reviewed rational for biopsy.  You do have multiple fibroids, they appear to have gotten bigger since your last [place].  Recommend seeing one of  the MD's to discuss [place], endometrial biopsy, and treatment options-hysterectomy was discussed  at previous appointment. Front Environmental manager to schedule pt with MD  Roberto Scales, CNM  [facility], [facility]  '@TODAY'$ @  3:19 PM

## 2022-01-29 NOTE — Telephone Encounter (Signed)
Patient is calling to speak with someone about her results. Patient is aware [facility] is our of the office and will return next week. Please advise?

## 2022-01-29 NOTE — Telephone Encounter (Signed)
Can you call pt to give her the results of her ultrasound since Elmo Putt is out until next week.

## 2022-01-31 ENCOUNTER — Other Ambulatory Visit (HOSPITAL_COMMUNITY)
Admission: RE | Admit: 2022-01-31 | Discharge: 2022-01-31 | Disposition: A | Discharge status: Home / Self Care | Payer: No Typology Code available for payment source | Source: Ambulatory Visit | Attending: Obstetrics | Admitting: Obstetrics

## 2022-01-31 ENCOUNTER — Ambulatory Visit: Payer: No Typology Code available for payment source | Admitting: Obstetrics

## 2022-01-31 ENCOUNTER — Encounter: Payer: Self-pay | Admitting: Obstetrics

## 2022-01-31 VITALS — BP 110/74 | HR 82 | Ht 63.0 in | Wt 164.0 lb

## 2022-01-31 DIAGNOSIS — D250 Submucous leiomyoma of uterus: Secondary | ICD-10-CM

## 2022-01-31 DIAGNOSIS — R300 Dysuria: Secondary | ICD-10-CM

## 2022-01-31 DIAGNOSIS — D251 Intramural leiomyoma of uterus: Secondary | ICD-10-CM | POA: Insufficient documentation

## 2022-01-31 DIAGNOSIS — R9389 Abnormal findings on diagnostic imaging of other specified body structures: Secondary | ICD-10-CM | POA: Insufficient documentation

## 2022-01-31 DIAGNOSIS — D252 Subserosal leiomyoma of uterus: Secondary | ICD-10-CM | POA: Diagnosis not present

## 2022-01-31 DIAGNOSIS — Z7689 Persons encountering health services in other specified circumstances: Secondary | ICD-10-CM | POA: Insufficient documentation

## 2022-01-31 LAB — POCT URINALYSIS DIPSTICK
Appearance: NORMAL
Bilirubin, UA: NEGATIVE
Blood, UA: NEGATIVE
Glucose, UA: NEGATIVE
Ketones, UA: NEGATIVE
Leukocytes, UA: NEGATIVE
Nitrite, UA: NEGATIVE
Odor: NORMAL
Protein, UA: NEGATIVE
Spec Grav, UA: 1.015 (ref 1.010–1.025)
Urobilinogen, UA: 0.2 E.U./dL
pH, UA: 6 (ref 5.0–8.0)

## 2022-01-31 LAB — POCT URINE PREGNANCY: Preg Test, Ur: NEGATIVE

## 2022-01-31 NOTE — Patient Instructions (Signed)
Have a great year! Please call with any concerns. Don't forget to wear your seatbelt everyday! If you are not signed up on MyChart, please ask [place] how to sign up for it!   In a world where you can be anything, please be kind.   Body mass index is 29.05 kg/m.  A Healthy Lifestyle: Care Instructions Your Care Instructions  A healthy lifestyle can help you feel good, stay at a healthy weight, and have plenty of energy for both work and play. A healthy lifestyle is something you can share with your whole family. A healthy lifestyle also can lower your risk for serious health problems, such as high blood pressure, heart disease, and diabetes. You can follow a few steps listed below to improve your health and the health of your family. Follow-up care is a key part of your treatment and safety. Be sure to make and go to all appointments, and call your doctor if you are having problems. It's also a good idea to know your test results and keep a list of the medicines you take. How can you care for yourself at home? Do not eat too much sugar, fat, or fast foods. You can still have dessert and treats now and then. The goal is moderation. Start small to improve your eating habits. Pay attention to portion sizes, drink less juice and soda pop, and eat more fruits and vegetables. Eat a healthy amount of food. A 3-ounce serving of meat, for example, is about the size of a deck of cards. Fill the rest of your plate with vegetables and whole grains. Limit the amount of soda and sports drinks you have every day. Drink more water when you are thirsty. Eat at least 5 servings of fruits and vegetables every day. It may seem like a lot, but it is not hard to reach this goal. A serving or helping is 1 piece of fruit, 1 cup of vegetables, or 2 cups of leafy, raw vegetables. Have an apple or some carrot sticks as an afternoon snack instead of a candy bar. Try to have fruits and/or vegetables at every meal. Make exercise  part of your daily routine. You may want to start with simple activities, such as walking, bicycling, or slow swimming. Try to be active 30 to 60 minutes every day. You do not need to do all 30 to 60 minutes all at once. For example, you can exercise 3 times a day for 10 or 20 minutes. Moderate exercise is safe for most people, but it is always a good idea to talk to your doctor before starting an exercise program. Keep moving. Mow the lawn, work in the garden, or [employer]. Take the stairs instead of the elevator at work. If you smoke, quit. People who smoke have an increased risk for heart attack, stroke, cancer, and other lung illnesses. Quitting is hard, but there are ways to boost your chance of quitting tobacco for good. Use nicotine gum, patches, or lozenges. Ask your doctor about stop-smoking programs and medicines. Keep trying. In addition to reducing your risk of diseases in the future, you will notice some benefits soon after you stop using tobacco. If you have shortness of breath or asthma symptoms, they will likely get better within a few weeks after you quit. Limit how much alcohol you drink. Moderate amounts of alcohol (up to 2 drinks a day for men, 1 drink a day for women) are okay. But drinking too much can lead to  liver problems, high blood pressure, and other health problems. Family health If you have a family, there are many things you can do together to improve your health. Eat meals together as a family as often as possible. Eat healthy foods. This includes fruits, vegetables, lean meats and dairy, and whole grains. Include your family in your fitness plan. Most people think of activities such as jogging or tennis as the way to fitness, but there are many ways you and your family can be more active. Anything that makes you breathe hard and gets your heart pumping is exercise. Here are some tips: Walk to do errands or to take your child to school or the bus. Go for a family  bike ride after dinner instead of watching TV. Care instructions adapted under license by your healthcare professional. This care instruction is for use with your licensed healthcare professional. If you have questions about a medical condition or this instruction, always ask your healthcare professional. [place] any warranty or liability for your use of this information.

## 2022-01-31 NOTE — Progress Notes (Signed)
Chief Complaint  Patient presents with   Gynecologic Exam    EMB for thickening endometrium per 01/24/22 ultrasound fibroids enlarged since 05/2020 ultrasound   Patient Julie Ochoa is an 41 y.o. year old [id number] Patient's last menstrual period was 01/05/2022 (exact date). currently BTL for contraception who presents for endometrial biopsy for thickened endometrium fibroids and desire for hyst. Her partner is present at visit.   ULTRASOUND REPORT   Location: [facility]  Date of Service: 01/24/2022    Indications:Abnormal Uterine Bleeding    Findings:  The uterus is anteverted and measures 13.4 x 11.0 x 7.7 cm. Echo texture is heterogenous with evidence of focal masses.   Within the uterus are 10+ suspected fibroids measuring: Fibroid 1:  6.6 x 6.6 x 7.2 cm; subserosal. Fibroid 2:  2.3 x 1.9 x 2.3 cm; submucosal. Fibroid 3:   2.2 x 1.7 x 1.7 cm; submucosal.   The Endometrium measures 41.7 mm.   Ovaries are not visualized. Survey of the adnexa demonstrates no adnexal masses. There is no free fluid in the cul de sac.   Impression: 1. Fibroid Uterus. (Numerous)  PRIOR RECORDS 5/22: [name]   [name] is a 41 y.o. [id number] whose LMP was Patient's last menstrual period was 01/05/2022 (exact date)., presents today for menometrorrhagia. Hx of large leio 10/21 with discussion of hyst with Dr. Gilman Ochoa at that time. Didn't do surg due to recovery time and cost. Would like hyst now. Menses are monthly, lasting 5 days real flow, 10 days total with spotting at end of cycle, heavy flow changing products Q45 min for 3 days, with 1/2 dollar sized clots, severe dysmen, doesn't take meds. No urin or GI sx. Had CBC with PCP 2/23 with H/H=11.2/34.2, taking Fe supp.  She is sex active, no pain/bleeding.  S/p TL.  Neg pap/neg HPV DNA 9/21   10/21 GYN u/s results: Fibroid 1: 70.8 x 61.3 x 65.6 mm Posterior subserosal, lower uterine segment Fibroid 2:43.2 x 26.5 x 37.9  mm Subserosal fundal Fibroid 3: 19.0 x 13.7 x 17.2 mm Intramural left Fibroid 4: 21.7 x 13.9 x 20.5 mm Intramural left vs. Submucosal Fibroid 5: 29.1 x 21.7 x 29.3 mm Intramural left vs. Partially submucosal Fibroid 6: 29.9 x 21.0 x 19.9 mm Subserosal left Fibroid 7: 14.9 x 14.9 x 16.8 mm. Intramural left Fibroid 8: 16.0 x 9.0 x 19.9 mm Intramural left Fibroid 9: 31.1 x 22.2 x 25.4 mm. Posterior subserosal right   There is a FH of ovarian cancer in her MGM and stomach cancer in her mat aunt, genetic testing not done.   Review of Systems  Constitutional:  Negative for activity change, appetite change, chills, diaphoresis, fatigue, fever and unexpected weight change.  HENT:  Negative for congestion, dental problem, drooling, ear discharge, ear pain, facial swelling, hearing loss, mouth sores, nosebleeds, postnasal drip, rhinorrhea, sinus pressure, sinus pain, sneezing, sore throat, tinnitus, trouble swallowing and voice change.   Eyes:  Negative for photophobia, pain, discharge, redness, itching and visual disturbance.  Respiratory:  Negative for apnea, cough, choking, chest tightness, shortness of breath, wheezing and stridor.   Cardiovascular:  Negative for chest pain, palpitations and leg swelling.  Gastrointestinal:  Negative for abdominal distention, abdominal pain, anal bleeding, blood in stool, constipation, diarrhea, nausea, rectal pain and vomiting.  Endocrine: Negative for cold intolerance, heat intolerance, polydipsia, polyphagia and polyuria.  Genitourinary:  Positive for dysuria, menstrual problem, pelvic pain and vaginal bleeding. Negative for decreased urine  volume, difficulty urinating, dyspareunia, enuresis, flank pain, frequency, genital sores, hematuria, urgency, vaginal discharge and vaginal pain.  Musculoskeletal:  Negative for arthralgias, back pain, gait problem, joint swelling, myalgias, neck pain and neck stiffness.  Skin:  Negative for color change, pallor, rash and  wound.  Allergic/Immunologic: Negative for environmental allergies, food allergies and immunocompromised state.  Neurological:  Negative for dizziness, tremors, seizures, syncope, facial asymmetry, speech difficulty, weakness, light-headedness, numbness and headaches.  Hematological:  Negative for adenopathy. Does not bruise/bleed easily.  Psychiatric/Behavioral:  Negative for agitation, behavioral problems, confusion, decreased concentration, dysphoric mood, hallucinations, self-injury, sleep disturbance and suicidal ideas. The patient is not nervous/anxious and is not hyperactive.     Past Medical History:  Diagnosis Date   Anxiety    Depression    Past Surgical History:  Procedure Laterality Date   OTHER SURGICAL HISTORY     Tubal Reversal   TUBAL LIGATION     Family History  Problem Relation Age of Onset   Leukemia Sister    Stomach cancer Maternal Aunt    Ovarian cancer Maternal Grandmother        41s   Prostate cancer Paternal Grandfather    Breast cancer Cousin    Social History   Socioeconomic History   Marital status: Married    Spouse name: Not on file   Number of children: Not on file   Years of education: Not on file   Highest education level: Not on file  Occupational History   Not on file  Tobacco Use   Smoking status: Never   Smokeless tobacco: Never  Vaping Use   Vaping Use: Never used  Substance and Sexual Activity   Alcohol use: No   Drug use: No   Sexual activity: Yes    Birth control/protection: Surgical    Comment: Tubal Ligation  Other Topics Concern   Not on file  Social History Narrative   Not on file   Social Determinants of Health   Financial Resource Strain: Not on file  Food Insecurity: Not on file  Transportation Needs: Not on file  Physical Activity: Not on file  Stress: Not on file  Social Connections: Not on file  Intimate Partner Violence: Not on file     Medicine list and allergies reviewed and updated.    BP 110/74    Pulse 82   Ht '5\' 3"'$  (1.6 m)   Wt 164 lb (74.4 kg)   LMP 01/05/2022 (Exact Date)   BMI 29.05 kg/m  Physical Exam Vitals reviewed. Exam conducted with a chaperone present.  Constitutional:      General: She is not in acute distress.    Appearance: Normal appearance. She is not ill-appearing.  HENT:     Head: Normocephalic and atraumatic.  Eyes:     Extraocular Movements: Extraocular movements intact.  Cardiovascular:     Rate and Rhythm: Normal rate.  Pulmonary:     Effort: Pulmonary effort is normal. No respiratory distress.  Genitourinary:    General: Normal vulva.     Exam position: Lithotomy position.     Pubic Area: No rash.      Labia:        Right: No rash, tenderness or lesion.        Left: No rash, tenderness or lesion.      Vagina: Normal. No vaginal discharge, tenderness or lesions.     Cervix: No discharge, friability, lesion or cervical bleeding.     Uterus: Normal. Deviated and enlarged.  Not tender and no uterine prolapse.      Adnexa:        Right: Fullness present. No tenderness.         Left: Fullness (enc bi) present. No tenderness.       Comments: Enlarged 16 week size uterus prominent right culdesac and posterior left near cervix; cervix deviated to left Musculoskeletal:        General: Normal range of motion.  Lymphadenopathy:     Lower Body: No right inguinal adenopathy. No left inguinal adenopathy.  Skin:    General: Skin is warm.  Neurological:     General: No focal deficit present.     Mental Status: She is alert and oriented to person, place, and time. Mental status is at baseline.     Coordination: Coordination normal.  Psychiatric:        Mood and Affect: Mood normal.        Behavior: Behavior normal.        Thought Content: Thought content normal.        Judgment: Judgment normal.     Procedure Note: Endometrial biopsy   The nature of the procedure and indications were explained to the patient who agrees to proceed. The speculum was  inserted. The cervix was prepped with Betadine.  The cervix was grasped with an Tenaculum.. The Pipelle was inserted directly into the endometrial cavity to 10 cm and a large amount of tissue was obtained on 1 pass(es). The manipulator was removed with hemostasis noted, silver nitrate sticks applied as needed. The patient tolerated the procedure well and left the office in good condition.   Intramural, submucous, and subserous leiomyoma of uterus - Plan: Surgical pathology  Dysuria - Plan: POCT Urinalysis Dipstick  Encounter for biopsy - Plan: POCT urine pregnancy, Surgical pathology  Thickened endometrium - Plan: Surgical pathology  Intramural, submucous, and subserous leiomyoma of uterus - Plan: Surgical pathology  Dysuria - Plan: POCT Urinalysis Dipstick  Encounter for biopsy - Plan: POCT urine pregnancy, Surgical pathology  Thickened endometrium - Plan: Surgical pathology  Pt reported dysuria - UA neg today Fibroids discussed in detail with patient today. [place] reviewed.  We discussed that definitive management of fibroids is hysterectomy which she desires. We discussed different types of hysts. She will follow up at [facility] to discuss surgery.  EMB today.   Return if symptoms worsen or fail to improve, for plan follow up to discuss [facility].  A total of 24 minutes were spent for the patient inclusive of face-to-face time during visit, preparation, review, communication, and documentation during this encounter.

## 2022-02-04 ENCOUNTER — Telehealth: Payer: Self-pay

## 2022-02-04 LAB — SURGICAL PATHOLOGY

## 2022-02-04 NOTE — Telephone Encounter (Signed)
Pt left msg on triage asking if her biopsy results were back. I called pt back and advised JC out of the office today, and that as soon as she reviews results she will notify pt via mychart or have one of [place] reach out to her.

## 2022-02-06 NOTE — Progress Notes (Signed)
Hey Dr. Sharlet Salina spoke with Julie Ochoa this morning and she wanted to know if you could refer to someone who could do the laparoscopy? She stated you told her that Dr. Amalia Hailey and Dr. Marcelline Mates wasn't specialized for that.

## 2022-02-07 ENCOUNTER — Ambulatory Visit: Payer: No Typology Code available for payment source | Admitting: Obstetrics

## 2022-02-11 ENCOUNTER — Telehealth: Payer: Self-pay

## 2022-02-11 NOTE — Telephone Encounter (Signed)
Pt calling for Dr Sharlet Salina, wanting to know if the referral for the Laparoscopic Hysterectomy was put in. Please advise

## 2022-02-11 NOTE — Telephone Encounter (Signed)
Dr Marcelline Mates said you can refer to her, she can do it minimally invasive

## 2022-02-12 NOTE — Telephone Encounter (Signed)
I believe you can just schedule follow up there

## 2022-02-12 NOTE — Telephone Encounter (Signed)
How is a referral put in for Dr Marcelline Mates?

## 2022-02-12 NOTE — Telephone Encounter (Signed)
Pt aware to call [facility] and schedule an appointment with Dr Marcelline Mates.

## 2022-02-12 NOTE — Progress Notes (Signed)
I called Julie Ochoa and advised her to schedule with Dr Marcelline Mates at [facility]. Julie Ochoa stateed she will call and set up an appointment with Dr Marcelline Mates.

## 2022-02-12 NOTE — Telephone Encounter (Signed)
Patient is calling needing to be referred to Dr. Marcelline Mates for this follow up for this procedure. Please advise?

## 2022-02-15 ENCOUNTER — Encounter: Payer: Self-pay | Admitting: Obstetrics

## 2022-02-15 ENCOUNTER — Other Ambulatory Visit: Payer: Self-pay | Admitting: Obstetrics

## 2022-02-15 DIAGNOSIS — D250 Submucous leiomyoma of uterus: Secondary | ICD-10-CM

## 2022-02-15 NOTE — Progress Notes (Signed)
Referral placed.

## 2022-02-18 ENCOUNTER — Telehealth: Payer: Self-pay | Admitting: Obstetrics and Gynecology

## 2022-02-18 NOTE — Telephone Encounter (Signed)
Pt called to schedule an NEW GYN appt  with Dr. Valentino Saxon. Pt has a REF for a Surgery consult from [facility]. Informed pt Camelia Eng would return her call to schedule.

## 2022-03-13 ENCOUNTER — Encounter: Payer: Self-pay | Admitting: Obstetrics and Gynecology

## 2022-03-13 NOTE — Progress Notes (Signed)
GYNECOLOGY PROGRESS NOTE  Subjective:    Patient ID: Julie Ochoa, female    DOB: [birth year], 41 y.o.   MRN: [id number]  HPI  Patient is a 41 y.o. [id number] female who presents for referral from [facility] for management of her fibroid uterus and menometrorrhagia. She has known about her fibroids since 2021.  At that time she was not ready to consider surgery, however now notes that she is. Is thinking about hysterectomy.  She reports monthly menstrual cycles that last 5-6 days, however first 3 days are very heavy, using a super tampon and pad, changing every 45 minutes. Also notes passage of silver dollar-sized clots and severe dysmenorrhea. At times has some spotting in between cycles.  Currently taking iron supplements for mild anemia.    Menstrual History: OB History     Gravida  5   Para  3   Term  3   Preterm      AB  2   Living  2      SAB  2   IAB      Ectopic      Multiple      Live Births  2           Menarche age: 29 Patient's last menstrual period was 03/02/2022 (exact date). Period Cycle (Days): 28 Period Duration (Days): 5-7 Period Pattern: Regular Dysmenorrhea: (!) Severe (patient reports moderate to severe) Dysmenorrhea Symptoms: Cramping, Throbbing, Headache  Last pap smear: NILM HR HPV neg, 04/2020.  Contraception: tubal ligation    The following portions of the patient's history were reviewed and updated as appropriate: She  has a past medical history of Anxiety and Depression.  She  has a past surgical history that includes Tubal ligation and Other surgical history.  Her family history includes Breast cancer in her cousin; Leukemia in her sister; Ovarian cancer in her maternal grandmother; Prostate cancer in her paternal grandfather; Stomach cancer in her maternal aunt.  She  reports that she has never smoked. She has never used smokeless tobacco. She reports that she does not drink alcohol and does not use drugs.  Current  Outpatient Medications on File Prior to Visit  Medication Sig Dispense Refill   Ferrous Sulfate (IRON PO) Take 1 tablet by mouth daily.     FLUoxetine (PROZAC) 10 MG capsule TAKE 1 CAPSULE BY MOUTH EVERY DAY 90 capsule 3   No current facility-administered medications on file prior to visit.   She has No Known Allergies..  Review of Systems Pertinent items are noted in HPI.   Objective:   Blood pressure 118/64, pulse 62, resp. rate 16, height '5\' 3"'$  (1.6 m), weight 166 lb 1.6 oz (75.3 kg), last menstrual period 03/02/2022, SpO2 100 %.  Body mass index is 29.42 kg/m. General appearance: alert, cooperative, and no distress Abdomen: normal findings: no organomegaly and soft, non-tender and abnormal findings:  mass, located arising from the pelvis, measuring up to umbilicus.  Pelvic: deferred Extremities: extremities normal, atraumatic, no cyanosis or edema Neurologic: Grossly normal   Labs:  Reviewed in Care Everywhere (10/15/2021) - H/H 11.2/34.2, TSH nml 1.6.    Pathology:  A. ENDOMETRIUM, BIOPSY:  Late secretory endometrium.  Negative for hyperplasia, malignancy, polyp and endometritis   Imaging:  Patient Name: Julie Ochoa DOB: 06-Mar-1981 MRN: [id number]   ULTRASOUND REPORT   Location: [facility]  Date of Service: 01/24/2022    Indications:Abnormal Uterine Bleeding    Findings:  The uterus is  anteverted and measures 13.4 x 11.0 x 7.7 cm. Echo texture is heterogenous with evidence of focal masses.   Within the uterus are 10+ suspected fibroids measuring: Fibroid 1:  6.6 x 6.6 x 7.2 cm; subserosal. Fibroid 2:  2.3 x 1.9 x 2.3 cm; submucosal. Fibroid 3:   2.2 x 1.7 x 1.7 cm; submucosal.   The Endometrium measures 41.7 mm.   Ovaries are not visualized. Survey of the adnexa demonstrates no adnexal masses. There is no free fluid in the cul de sac.   Impression: 1. Fibroid Uterus. (Numerous)   Recommendations: 1.Clinical correlation with the  patient's History and Physical Exam.   Vivien Rota  [place]    The ultrasound images and findings were reviewed by me and I agree with the above report.   Finis Bud, M.D. 01/27/2022 3:12 PM   10/21 GYN u/s results: Fibroid 1: 70.8 x 61.3 x 65.6 mm Posterior subserosal, lower uterine segment Fibroid 2:43.2 x 26.5 x 37.9 mm Subserosal fundal Fibroid 3: 19.0 x 13.7 x 17.2 mm Intramural left Fibroid 4: 21.7 x 13.9 x 20.5 mm Intramural left vs. Submucosal Fibroid 5: 29.1 x 21.7 x 29.3 mm Intramural left vs. Partially submucosal Fibroid 6: 29.9 x 21.0 x 19.9 mm Subserosal left Fibroid 7: 14.9 x 14.9 x 16.8 mm. Intramural left Fibroid 8: 16.0 x 9.0 x 19.9 mm Intramural left Fibroid 9: 31.1 x 22.2 x 25.4 mm. Posterior subserosal right  Assessment:   1. Intramural, submucous, and subserous leiomyoma of uterus   2. Menometrorrhagia      Plan:   - Patient with heavy (and sometimes irregular) bleeding with uterine fibroids. Has had negative labs and biopsy. Discussion had with patient regarding all management options for fibroid uterus and menometrorrhagia, including:  tranexamic acid (Lysteda), Depo Provera, GnRH antagonist (Myefembree) or Depot Lupron, uterine fibroid embolization, hysteroscopic myomectomy with Sonata device,  hysterectomy as definitive surgical management.  Discussed risks and benefits of each method.   Patient desires trial of Depot Lupron as it may help to shrink fibroids and can be beneficial if she decides on surgery at a later time.  Printed patient education handouts were given to the patient to review at home.  Prescription sent to patient's pharmacy. Will f/u 1 month after receiving first dose.   A total of 40 minutes were spent during this encounter, including review of previous progress notes, recent imaging and labs, face-to-face with time with patient involving counseling and coordination of care, as well as documentation for current visit.   Rubie Maid,  MD [facility]

## 2022-03-13 NOTE — Progress Notes (Deleted)
    GYNECOLOGY PROGRESS NOTE  Subjective:    Patient ID: Julie Ochoa, female    DOB: Sep 25, 1980, 41 y.o.   MRN: [id number]  HPI  Patient is a 41 y.o. [id number] female who presents for   {Common ambulatory SmartLinks:19316}  Review of Systems {ros; complete:30496}   Objective:   There were no vitals taken for this visit. There is no height or weight on file to calculate BMI. General appearance: {general exam:16600} Abdomen: {abdominal exam:16834} Pelvic: {pelvic exam:16852::"cervix normal in appearance","external genitalia normal","no adnexal masses or tenderness","no cervical motion tenderness","rectovaginal septum normal","uterus normal size, shape, and consistency","vagina normal without discharge"} Extremities: {extremity exam:5109} Neurologic: {neuro exam:17854}   Assessment:   No diagnosis found.   Plan:   There are no diagnoses linked to this encounter.

## 2022-03-14 ENCOUNTER — Encounter: Payer: Self-pay | Admitting: Obstetrics and Gynecology

## 2022-03-14 ENCOUNTER — Ambulatory Visit (INDEPENDENT_AMBULATORY_CARE_PROVIDER_SITE_OTHER): Payer: Self-pay | Admitting: Obstetrics and Gynecology

## 2022-03-14 VITALS — BP 118/64 | HR 62 | Resp 16 | Ht 63.0 in | Wt 166.1 lb

## 2022-03-14 DIAGNOSIS — D251 Intramural leiomyoma of uterus: Secondary | ICD-10-CM

## 2022-03-14 DIAGNOSIS — D250 Submucous leiomyoma of uterus: Secondary | ICD-10-CM

## 2022-03-14 DIAGNOSIS — D252 Subserosal leiomyoma of uterus: Secondary | ICD-10-CM

## 2022-03-14 DIAGNOSIS — N921 Excessive and frequent menstruation with irregular cycle: Secondary | ICD-10-CM

## 2022-03-14 MED ORDER — LUPRON DEPOT (1-MONTH) 3.75 MG IM KIT: 3.7500 mg | PACK | INTRAMUSCULAR | 2 refills | Status: DC

## 2022-03-18 ENCOUNTER — Encounter: Payer: Self-pay | Admitting: Obstetrics and Gynecology

## 2022-03-19 ENCOUNTER — Telehealth: Payer: Self-pay | Admitting: Obstetrics and Gynecology

## 2022-03-19 NOTE — Telephone Encounter (Signed)
Pt states pharmacy needs a prior authorization to fill LUPRON DEPOT injection.

## 2022-03-22 NOTE — Telephone Encounter (Signed)
Prior authorization has been initiated and sent to Dr. Marcelline Mates to sign.

## 2022-03-26 ENCOUNTER — Telehealth: Payer: Self-pay

## 2022-03-26 DIAGNOSIS — F3281 Premenstrual dysphoric disorder: Secondary | ICD-10-CM

## 2022-03-26 MED ORDER — FLUOXETINE HCL 10 MG PO CAPS: ORAL_CAPSULE | ORAL | 3 refills | Status: DC

## 2022-03-26 NOTE — Telephone Encounter (Signed)
Refill submitted per request.   Dr. Marcelline Mates

## 2022-03-26 NOTE — Telephone Encounter (Signed)
[facility]ill request recv'd from [facility] [address] for fluoxetine HCL 10 mg capsule, one po qd.

## 2022-03-27 ENCOUNTER — Telehealth: Payer: Self-pay | Admitting: Obstetrics and Gynecology

## 2022-03-27 NOTE — Telephone Encounter (Signed)
Patient called and states that pharmacy called her in reference to her prior authorization for the RX Lupron Depot. Patient called today inquiring on where we are with getting Prior authorization approved. Please advise.

## 2022-03-28 NOTE — Telephone Encounter (Signed)
We have completed her paperwork and submitted it.

## 2022-04-02 NOTE — Telephone Encounter (Addendum)
Patient is calling to follow up on request for prior authorization. Patient has been informed that to PA was denied as of 03/26/22.Patient just found out and would like to know what she needs to do so we can send in her lupron. Please advise? Patient states she will be contacting her insurance.

## 2022-04-04 ENCOUNTER — Encounter: Payer: Self-pay | Admitting: Obstetrics and Gynecology

## 2022-04-15 ENCOUNTER — Ambulatory Visit (INDEPENDENT_AMBULATORY_CARE_PROVIDER_SITE_OTHER): Payer: No Typology Code available for payment source | Admitting: Obstetrics and Gynecology

## 2022-04-15 VITALS — BP 110/75 | HR 71 | Resp 16 | Ht 63.0 in | Wt 166.9 lb

## 2022-04-15 DIAGNOSIS — N921 Excessive and frequent menstruation with irregular cycle: Secondary | ICD-10-CM

## 2022-04-15 DIAGNOSIS — Z3042 Encounter for surveillance of injectable contraceptive: Secondary | ICD-10-CM

## 2022-04-15 DIAGNOSIS — D250 Submucous leiomyoma of uterus: Secondary | ICD-10-CM | POA: Diagnosis not present

## 2022-04-15 DIAGNOSIS — D251 Intramural leiomyoma of uterus: Secondary | ICD-10-CM | POA: Diagnosis not present

## 2022-04-15 DIAGNOSIS — D252 Subserosal leiomyoma of uterus: Secondary | ICD-10-CM | POA: Diagnosis not present

## 2022-04-15 MED ORDER — LEUPROLIDE ACETATE 3.75 MG IM KIT
3.7500 mg | PACK | Freq: Once | INTRAMUSCULAR | Status: AC
  Administered 2022-04-15: 3.75 mg via INTRAMUSCULAR

## 2022-04-15 NOTE — Progress Notes (Signed)
Patient was seen today for administration of Lupron Depo. She tolerated injection well. Informed her to follow up in 28 days for next injection. She has two more injection left. She verbalized understanding.

## 2022-04-25 ENCOUNTER — Encounter: Payer: Self-pay | Admitting: Obstetrics and Gynecology

## 2022-05-13 ENCOUNTER — Ambulatory Visit (INDEPENDENT_AMBULATORY_CARE_PROVIDER_SITE_OTHER): Payer: No Typology Code available for payment source | Admitting: Obstetrics and Gynecology

## 2022-05-13 VITALS — BP 115/72 | HR 53 | Ht 63.0 in | Wt 164.0 lb

## 2022-05-13 DIAGNOSIS — D250 Submucous leiomyoma of uterus: Secondary | ICD-10-CM

## 2022-05-13 DIAGNOSIS — N921 Excessive and frequent menstruation with irregular cycle: Secondary | ICD-10-CM

## 2022-05-13 DIAGNOSIS — Z3042 Encounter for surveillance of injectable contraceptive: Secondary | ICD-10-CM | POA: Diagnosis not present

## 2022-05-13 MED ORDER — LEUPROLIDE ACETATE 3.75 MG IM KIT
3.7500 mg | PACK | Freq: Once | INTRAMUSCULAR | Status: AC
  Administered 2022-05-13: 3.75 mg via INTRAMUSCULAR

## 2022-05-13 MED ORDER — MEDROXYPROGESTERONE ACETATE 150 MG/ML IM SUSP
150.0000 mg | Freq: Once | INTRAMUSCULAR | Status: DC
Start: 2022-05-13 — End: 2022-05-13

## 2022-05-13 NOTE — Progress Notes (Signed)
Patient was seen today for administration of Lupron Depo. She tolerated injection well. Informed her to follow up in 28 days for next injection. She has one more injection left. She verbalized understanding

## 2022-06-10 ENCOUNTER — Ambulatory Visit: Payer: No Typology Code available for payment source

## 2022-06-11 NOTE — Progress Notes (Unsigned)
    GYNECOLOGY PROGRESS NOTE  Subjective:    Patient ID: Julie Ochoa, female    DOB: 1980/11/26, 41 y.o.   MRN: [id number]  HPI  Patient is a 41 y.o. [id number] female who presents for evaluation of right breast pain. She has been having burning, itching and burning sensation in her right breast x 3-4 weeks that comes and goes. Pain mostly near center of her chest near where her sternum meets the right breast. Her last mammogram was 01/28/2022 and it was normal. She has a family history of breast cancer with a cousin.   Additionally, patient presented for 3rd dose of Depot Lupron administration (however was not available for her to pick up at the pharmacy today so will return tomorrow to receive her dose).   The following portions of the patient's history were reviewed and updated as appropriate: allergies, current medications, past family history, past medical history, past social history, past surgical history, and problem list.  Review of Systems Pertinent items noted in HPI and remainder of comprehensive ROS otherwise negative.   Objective:   Blood pressure (!) 147/80, pulse (!) 48, resp. rate 16, height '5\' 4"'$  (1.626 m), weight 170 lb 8 oz (77.3 kg), last menstrual period 04/25/2022. Body mass index is 29.27 kg/m. General appearance: alert, cooperative, and no distress Breasts: breasts appear normal, no suspicious masses, no skin or nipple changes or axillary nodes.  Mild tenderness located at midsternum and right breast.     Assessment:   1. Costochondral chest pain   2. Intramural, submucous, and subserous leiomyoma of uterus   3. Menometrorrhagia      Plan:   1. Costochondral chest pain -Further discussion of patient's pain, it appears more sterile in nature versus breast.  Patient also does admit to increased upper body physical activity over the past several weeks.  Discussed diagnosis of costochondritis with patient.  Advised on use of NSAIDs and decreasing activity  for several days to help symptoms improve.  2. Intramural, submucous, and subserous leiomyoma of uterus -Patient to continue Depo-Lupron for at least 1 more dose.  We will order pelvic ultrasound after completion of 3 month trial of Lupron to see if uterine fibroids have decreased in size.  Patient does ultimately plan on hysterectomy for definitive management. - US PELVIS (TRANSABDOMINAL ONLY); Future  3. Menometrorrhagia -Currently on Depo-Lupron, not having cycles at this time. - US PELVIS (TRANSABDOMINAL ONLY); Future   Patient to follow-up in the next several days for Depo-Lupron administration, and in 1 month for follow-up after ultrasound to assess for fibroid size.  Rubie Maid, MD [facility]

## 2022-06-12 ENCOUNTER — Encounter: Payer: Self-pay | Admitting: Obstetrics and Gynecology

## 2022-06-12 ENCOUNTER — Ambulatory Visit: Payer: No Typology Code available for payment source | Admitting: Obstetrics and Gynecology

## 2022-06-12 VITALS — BP 147/80 | HR 48 | Resp 16 | Ht 64.0 in | Wt 170.5 lb

## 2022-06-12 DIAGNOSIS — D250 Submucous leiomyoma of uterus: Secondary | ICD-10-CM

## 2022-06-12 DIAGNOSIS — D251 Intramural leiomyoma of uterus: Secondary | ICD-10-CM

## 2022-06-12 DIAGNOSIS — R0789 Other chest pain: Secondary | ICD-10-CM

## 2022-06-12 DIAGNOSIS — D252 Subserosal leiomyoma of uterus: Secondary | ICD-10-CM

## 2022-06-12 DIAGNOSIS — N921 Excessive and frequent menstruation with irregular cycle: Secondary | ICD-10-CM

## 2022-06-19 ENCOUNTER — Other Ambulatory Visit: Payer: Self-pay | Admitting: Obstetrics and Gynecology

## 2022-06-19 NOTE — Telephone Encounter (Signed)
Called patient no prescription is needed. They are going to go ahead and fill prescription since it was there fault. Patient should receive injection tomorrow, she will come in for a nurse visit to have injection.

## 2022-06-19 NOTE — Telephone Encounter (Signed)
11:49am Julie Ochoa from [facility] calling for refill of Lupron 3.'75mg'$ .  [phone]  Fax# [phone] 11:59am [facility] calling; PA denied; for appeal call [phone]; ph# where call came from is [phone] 12:10pm Pt calling stating [facility] has created a royal mess; [facility] and they need a new rx - verbal is okay; pt is hoping to p/u medication and get inj tomorrow if possible; pt would like to talk it thru with nurse.  [phone]

## 2022-06-20 ENCOUNTER — Ambulatory Visit: Payer: No Typology Code available for payment source

## 2022-06-21 ENCOUNTER — Ambulatory Visit (INDEPENDENT_AMBULATORY_CARE_PROVIDER_SITE_OTHER): Payer: No Typology Code available for payment source

## 2022-06-21 VITALS — BP 128/72 | HR 63 | Resp 16 | Ht 64.0 in | Wt 170.0 lb

## 2022-06-21 DIAGNOSIS — D251 Intramural leiomyoma of uterus: Secondary | ICD-10-CM

## 2022-06-21 DIAGNOSIS — N921 Excessive and frequent menstruation with irregular cycle: Secondary | ICD-10-CM | POA: Diagnosis not present

## 2022-06-21 DIAGNOSIS — D250 Submucous leiomyoma of uterus: Secondary | ICD-10-CM

## 2022-06-21 DIAGNOSIS — D252 Subserosal leiomyoma of uterus: Secondary | ICD-10-CM | POA: Diagnosis not present

## 2022-06-21 MED ORDER — LEUPROLIDE ACETATE 3.75 MG IM KIT
3.7500 mg | PACK | INTRAMUSCULAR | Status: DC
  Administered 2022-06-21: 3.75 mg via INTRAMUSCULAR

## 2022-06-21 NOTE — Progress Notes (Addendum)
    Nurse Visit Note  Subjective:    Patient ID: Julie Ochoa, female    DOB: 1981/03/24, 41 y.o.   MRN: [id number]  Patient is a 41 y.o. [id number] female who presents for Depo Lupron Injection. She is getting Depo Lupron injection to treat her Intramural submucous and subserous leiomyoma of uterus and Menometrorrhagia. This was her last injection until after her ultrasound if needed. If injection is needed she will need to have a prior authorization.     Cristy Folks, [place]

## 2022-06-21 NOTE — Patient Instructions (Signed)
Leuprolide Suspension for Injection (Endometriosis) What is this medication? LEUPROLIDE (loo PROE lide) treats endometriosis. This is a condition where the tissue that lines the uterus grows outside the uterus. It may also be used to treat uterine fibroids, growths of tissue in the uterus. It works by decreasing the amount of estrogen your body makes, which reduces heavy bleeding and pain. This medicine may be used for other purposes; ask your health care provider or pharmacist if you have questions. COMMON BRAND NAME(S): Lupron Depot What should I tell my care team before I take this medication? They need to know if you have any of these conditions: Frequently drink alcohol Mental health conditions Osteoporosis, weak bones Seizures Tobacco use Unexplained vaginal bleeding An unusual or allergic reaction to leuprolide, other medications, foods, dyes, or preservatives Pregnant or trying to get pregnant Breast-feeding How should I use this medication? This medication is injected into a muscle. You will be taught how to prepare and give it. Take it as directed on the prescription label at the same time every day. Keep taking it unless your care team tells you to stop. It is important that you put your used needles and syringes in a special sharps container. Do not put them in a trash can. If you do not have a sharps container, call your care team to get one. Talk to your care team about the use of this medication in children. Special care may be needed. Overdosage: If you think you have taken too much of this medicine contact a poison control center or emergency room at once. NOTE: This medicine is only for you. Do not share this medicine with others. What if I miss a dose? If you miss a dose, take it as soon as you can. If it is almost time for your next dose, take only that dose. Do not take double or extra doses. What may interact with this medication? Do not take this medication with any of  the following: Cisapride Dronedarone Ketoconazole Levoketoconazole Pimozide Thioridazine This list may not describe all possible interactions. Give your health care provider a list of all the medicines, herbs, non-prescription drugs, or dietary supplements you use. Also tell them if you smoke, drink alcohol, or use illegal drugs. Some items may interact with your medicine. What should I watch for while using this medication? Visit your care team for regular checks on your progress. Tell your care team if your symptoms do not start to get better or if they get worse. During the first weeks of therapy, your symptoms may get worse, then should improve as you continue treatment. You may experience a menstrual cycle or spotting during the first month of therapy. If this continues, contact your care team. Talk to your care team if you wish to become pregnant or think you might be pregnant. This medication can cause serious birth defects. A barrier contraceptive, such as a condom or diaphragm, is recommended while taking this medication. Talk to your care team about reliable forms of contraception. This medication may cause infertility. It is usually temporary. Talk to your care team if you are concerned about your fertility. Using this medication for a long time may weaken your bones. The risk of bone fractures may be increased. Talk to your care team about your bone health. What side effects may I notice from receiving this medication? Side effects that you should report to your care team as soon as possible: Allergic reactions--skin rash, itching, hives, swelling of the face, lips,  tongue, or throat Seizures Worsening mood, feelings of depression Side effects that usually do not require medical attention (report these to your care team if they continue or are bothersome): Acne Change in sex drive or performance Dizziness Headache Hot flashes Vaginal discharge Weight gain This list may not  describe all possible side effects. Call your doctor for medical advice about side effects. You may report side effects to FDA at 1-800-FDA-1088. Where should I keep my medication? Keep out of the reach of children and pets. Store at room temperature between 20 and 25 degrees C (68 and 77 degrees F). Get rid of any unused medication after the expiration date. To get rid of medications that are no longer needed or have expired: Take the medication to a medication take-back program. Check with your pharmacy or law enforcement to find a location. If you cannot return the medication, ask your pharmacist or care team how to get rid of this medication safely. NOTE: This sheet is a summary. It may not cover all possible information. If you have questions about this medicine, talk to your doctor, pharmacist, or health care provider.  2023 Elsevier/Gold Standard (2021-11-02 00:00:00)

## 2022-06-26 ENCOUNTER — Other Ambulatory Visit: Payer: Self-pay | Admitting: Obstetrics and Gynecology

## 2022-07-08 NOTE — Progress Notes (Deleted)
    GYNECOLOGY PROGRESS NOTE  Subjective:    Patient ID: Julie Ochoa, female    DOB: 09-23-1980, 41 y.o.   MRN: [id number]  HPI  Patient is a 42 y.o. [id number] female who presents for follow up on ultrasound results. She had a ultrasound today to follow up on her uterine fibroids. She has been treating fibroids with Depo Lupron. Her last lupron injection was done on 06/21/2022.    {Common ambulatory SmartLinks:19316}  Review of Systems {ros; complete:30496}   Objective:   Last menstrual period 04/25/2022. There is no height or weight on file to calculate BMI. General appearance: {general exam:16600} Abdomen: {abdominal exam:16834} Pelvic: {pelvic exam:16852::"cervix normal in appearance","external genitalia normal","no adnexal masses or tenderness","no cervical motion tenderness","rectovaginal septum normal","uterus normal size, shape, and consistency","vagina normal without discharge"} Extremities: {extremity exam:5109} Neurologic: {neuro exam:17854}     Assessment:   No diagnosis found.   Plan:   There are no diagnoses linked to this encounter.

## 2022-07-10 ENCOUNTER — Ambulatory Visit: Payer: No Typology Code available for payment source | Admitting: Obstetrics and Gynecology

## 2022-07-10 ENCOUNTER — Other Ambulatory Visit: Payer: Self-pay | Admitting: Obstetrics and Gynecology

## 2022-07-10 ENCOUNTER — Ambulatory Visit (INDEPENDENT_AMBULATORY_CARE_PROVIDER_SITE_OTHER): Payer: No Typology Code available for payment source

## 2022-07-10 DIAGNOSIS — D251 Intramural leiomyoma of uterus: Secondary | ICD-10-CM

## 2022-07-10 DIAGNOSIS — D250 Submucous leiomyoma of uterus: Secondary | ICD-10-CM | POA: Diagnosis not present

## 2022-07-10 DIAGNOSIS — N921 Excessive and frequent menstruation with irregular cycle: Secondary | ICD-10-CM

## 2022-07-10 DIAGNOSIS — D252 Subserosal leiomyoma of uterus: Secondary | ICD-10-CM

## 2022-07-10 DIAGNOSIS — R0789 Other chest pain: Secondary | ICD-10-CM

## 2022-07-12 ENCOUNTER — Ambulatory Visit: Payer: No Typology Code available for payment source | Admitting: Obstetrics and Gynecology

## 2022-07-12 ENCOUNTER — Encounter: Payer: Self-pay | Admitting: Obstetrics and Gynecology

## 2022-07-12 VITALS — BP 117/66 | HR 68 | Resp 16 | Ht 63.0 in | Wt 167.1 lb

## 2022-07-12 DIAGNOSIS — D250 Submucous leiomyoma of uterus: Secondary | ICD-10-CM | POA: Diagnosis not present

## 2022-07-12 DIAGNOSIS — D252 Subserosal leiomyoma of uterus: Secondary | ICD-10-CM | POA: Diagnosis not present

## 2022-07-12 DIAGNOSIS — D251 Intramural leiomyoma of uterus: Secondary | ICD-10-CM

## 2022-07-12 DIAGNOSIS — N921 Excessive and frequent menstruation with irregular cycle: Secondary | ICD-10-CM

## 2022-07-12 NOTE — Progress Notes (Signed)
GYNECOLOGY PROGRESS NOTE  Subjective:    Patient ID: Wende Bushy, female    DOB: 04-17-1981, 41 y.o.   MRN: [id number]  HPI  Patient is a 41 y.o. D3U2025 female who presents to follow on ultrasound for fibroids. She has been taking Lupron Depot for treatment.  Patient has been on Depo-Lupron 3 months. Repeat ultrasound was done on 07/10/2022.  The following portions of the patient's history were reviewed and updated as appropriate: allergies, current medications, past family history, past medical history, past social history, past surgical history, and problem list.  Review of Systems Pertinent items are noted in HPI.   Objective:  Blood pressure 117/66, pulse 68, resp. rate 16, height '5\' 3"'$  (1.6 m), weight 167 lb 1.6 oz (75.8 kg), last menstrual period 04/25/2022.  Body mass index is 29.6 kg/m.  General appearance: alert, cooperative, and no distress Abdomen: soft, non-tender; bowel sounds normal; no masses,  no organomegaly. Uterus difficult to palpate but feels 2-3 fingerbreadths below umbilicus.  Pelvic: deferred  Imaging:  Ultrasound report 07/10/2022.  Indications: f/u fibroids Findings:  The uterus is enlarged, anteverted and measures 12.5 x 9.7 x 7.3 cm. Echo texture is heterogenous with evidence of focal masses. Within the uterus are multiple suspected fibroids. The previously measured fibroids from 01/24/22 now measure: Fibroid 1: Subserosal, 6.0 x 6.5 x 5.6 cm Fibroid 2: Submucosal, 1.9 x 1.7 x 2.0 cm Fibroid 3: Submucosal, 1.4 x 1.3 x 1.5 cm *All slightly decreased in size*   The Endometrium measures 6.3 mm.   Right Ovary measures 2.5 x 3.7 x 1.8 cm. It is normal in appearance. Left Ovary measures 2.0 x2.0 x 1.7 cm. It is normal in appearance. Survey of the adnexa demonstrates no adnexal masses. There is no free fluid in the cul de sac.   Impression: 1. Enlarged, fibroid uterus 2. Fibroids measured from previous ultrasound appear to be slightly smaller  on today's study   Patient Name: NICO ROGNESS DOB: 1981-07-23 MRN: [id number]   ULTRASOUND REPORT   Location: [facility]  Date of Service: 01/24/2022    Indications:Abnormal Uterine Bleeding    Findings:  The uterus is anteverted and measures 13.4 x 11.0 x 7.7 cm. Echo texture is heterogenous with evidence of focal masses.   Within the uterus are 10+ suspected fibroids measuring: Fibroid 1:  6.6 x 6.6 x 7.2 cm; subserosal. Fibroid 2:  2.3 x 1.9 x 2.3 cm; submucosal. Fibroid 3:   2.2 x 1.7 x 1.7 cm; submucosal.   The Endometrium measures 41.7 mm.   Ovaries are not visualized. Survey of the adnexa demonstrates no adnexal masses. There is no free fluid in the cul de sac.   Impression: 1. Fibroid Uterus. (Numerous)   Recommendations: 1.Clinical correlation with the patient's History and Physical Exam.   Vivien Rota  [place]    The ultrasound images and findings were reviewed by me and I agree with the above report.   Finis Bud, M.D. 01/27/2022 3:12 PM  Assessment:   1. Intramural, submucous, and subserous leiomyoma of uterus   2. Menometrorrhagia      Plan:   Reviewed ultrasound results with patient. Patient wonders if her fibroids have shrunk enough at this time to have surgery.  I discussed that although small changes in volume have been noted, would still recommend additional injections.  Patient notes that she would like to have surgery by the end of the year if possible for insurance purposes. Discussed risks vs benefits  of continuing with additional Lupron therapy to enable laparoscopic procedure vs risk of having to convert to open procedure due to size and location of the fibroids. She notes understanding.  Also discussed that longer term use of Lupron could buy her time with her symptoms until mid-year if finances are a concern. Patient ok with plan.  RTC for next Lupron injection once it arrives from pharmacy.    A total of 20 minutes  were spent face-to-face with the patient during this encounter and over half of that time dealt with counseling and coordination of care.   Rubie Maid, MD [place]

## 2022-08-28 ENCOUNTER — Encounter: Payer: Self-pay | Admitting: Obstetrics and Gynecology

## 2022-08-30 ENCOUNTER — Other Ambulatory Visit: Payer: Self-pay

## 2022-08-30 MED ORDER — LUPRON DEPOT (3-MONTH) 11.25 MG IM KIT: 11.2500 mg | PACK | INTRAMUSCULAR | 0 refills | Status: DC

## 2022-09-06 ENCOUNTER — Ambulatory Visit: Payer: No Typology Code available for payment source

## 2022-09-06 VITALS — BP 120/83 | HR 56 | Ht 63.0 in | Wt 170.6 lb

## 2022-09-06 DIAGNOSIS — D252 Subserosal leiomyoma of uterus: Secondary | ICD-10-CM

## 2022-09-06 DIAGNOSIS — D251 Intramural leiomyoma of uterus: Secondary | ICD-10-CM

## 2022-09-06 DIAGNOSIS — D250 Submucous leiomyoma of uterus: Secondary | ICD-10-CM | POA: Diagnosis not present

## 2022-09-06 MED ORDER — LEUPROLIDE ACETATE 3.75 MG IM KIT
3.7500 mg | PACK | Freq: Once | INTRAMUSCULAR | Status: AC
  Administered 2022-09-06: 3.75 mg via INTRAMUSCULAR

## 2022-09-06 MED ORDER — MEDROXYPROGESTERONE ACETATE 150 MG/ML IM SUSP: 150.0000 mg | Freq: Once | INTRAMUSCULAR | Status: DC

## 2022-09-06 NOTE — Progress Notes (Signed)
    NURSE VISIT NOTE  Subjective:    Patient ID: Julie Ochoa, female    DOB: 1981/03/11, 42 y.o.   MRN: [id number]  HPI  Patient is a 42 y.o. [id number] female who presents for Depo Lupron injection.   Objective:    BP 120/83   Pulse (!) 56   Ht '5\' 3"'$  (1.6 m)   Wt 170 lb 9.6 oz (77.4 kg)   LMP 04/25/2022   BMI 30.22 kg/m   Indication for injection:  Fibroids LMP:  04/25/22 Contraception:   Lupron Serum HCG indicated?  N/a . Lupron order:  3.'75mg'$  IM x 1dose.   Order to administer given by Rubie Maid, MD on 07/12/2022. Lupron Depot 3.75 mg IM given by: Douglass Rivers, CMA. Site: Right Upper Outer Quandrant    Assessment:   1. Intramural, submucous, and subserous leiomyoma of uterus      Plan:   Next injection in 4 weeks.     Marykay Lex, CMA

## 2022-10-04 ENCOUNTER — Ambulatory Visit: Payer: No Typology Code available for payment source

## 2022-10-14 NOTE — Progress Notes (Deleted)
    GYNECOLOGY PROGRESS NOTE  Subjective:    Patient ID: Wende Bushy, female    DOB: 06/23/1981, 42 y.o.   MRN: [id number]  HPI  Patient is a 42 y.o. [id number] female who presents to discuss hysterectomy for Intramural, submucous, and subserous leiomyoma of uterus and menometrorrhagia  {Common ambulatory SmartLinks:19316}  Review of Systems {ros; complete:30496}   Objective:   There were no vitals taken for this visit. There is no height or weight on file to calculate BMI. General appearance: {general exam:16600} Abdomen: {abdominal exam:16834} Pelvic: {pelvic exam:16852::"cervix normal in appearance","external genitalia normal","no adnexal masses or tenderness","no cervical motion tenderness","rectovaginal septum normal","uterus normal size, shape, and consistency","vagina normal without discharge"} Extremities: {extremity exam:5109} Neurologic: {neuro exam:17854}   Assessment:   No diagnosis found.   Plan:   There are no diagnoses linked to this encounter.    Rubie Maid, MD [place]

## 2022-10-15 ENCOUNTER — Encounter: Payer: Self-pay | Admitting: Obstetrics and Gynecology

## 2022-10-15 ENCOUNTER — Ambulatory Visit: Payer: No Typology Code available for payment source | Admitting: Obstetrics and Gynecology

## 2022-10-15 DIAGNOSIS — N921 Excessive and frequent menstruation with irregular cycle: Secondary | ICD-10-CM

## 2022-10-15 DIAGNOSIS — D252 Subserosal leiomyoma of uterus: Secondary | ICD-10-CM

## 2022-10-15 DIAGNOSIS — Z79818 Long term (current) use of other agents affecting estrogen receptors and estrogen levels: Secondary | ICD-10-CM

## 2022-10-22 NOTE — Progress Notes (Unsigned)
GYNECOLOGY PREOPERATIVE HISTORY AND PHYSICAL   Subjective:  Julie Ochoa is a 42 y.o. [id number] here for definitive surgical management of enlarged, fibroid uterus, PMDD, and menorrhagia.  She has been on Lupron therapy since August 2023 and an attempt to manage her menstrual cycles as well as decreased size of the uterine fibroids in order to be a candidate for laparoscopic method of hysterectomy.  Prior to initiation of the Lupron, her cycles were approximately 5 to 6 days however the first 3 days were very heavy requiring use of super tampon and pad, having to change every 45 minutes.  Patient was also passing silver dollar-size clots and experiencing severe dysmenorrhea.  Had a history of anemia due to her heavy menses.   Proposed surgery: Robotic total laparoscopic hysterectomy with bilateral salpingectomy    Pertinent Gynecological History: Menses:  Amenorrheic when on Lupron, however missed her last dose due to insurance issues which led to a cycle in January  for 11 days.  Has since resumed her Lupron dose and did not have a cycle in February. Patient's last menstrual period was 09/12/2022 (exact date). Contraception: tubal ligation  Last mammogram: normal Date: 01/28/2022 Last pap: normal Date: 05/15/20   Past Medical History:  Diagnosis Date   Anxiety    Depression    Family history of ovarian cancer     Past Surgical History:  Procedure Laterality Date   OTHER SURGICAL HISTORY     Tubal Reversal   TUBAL LIGATION      OB History  Gravida Para Term Preterm AB Living  '5 3 3   2 2  '$ SAB IAB Ectopic Multiple Live Births  2       2    # Outcome Date GA Lbr Len/2nd Weight Sex Delivery Anes PTL Lv  5 Term           4 SAB           3 SAB           2 Term           1 Term             Family History  Problem Relation Age of Onset   Leukemia Sister    Ovarian cancer Maternal Grandmother        82s   Prostate cancer Paternal Grandfather    Stomach cancer  Maternal Aunt    Breast cancer Cousin    Colon cancer Maternal Uncle     Social History   Socioeconomic History   Marital status: Married    Spouse name: Not on file   Number of children: Not on file   Years of education: Not on file   Highest education level: Not on file  Occupational History   Not on file  Tobacco Use   Smoking status: Never   Smokeless tobacco: Never  Vaping Use   Vaping Use: Never used  Substance and Sexual Activity   Alcohol use: No   Drug use: No   Sexual activity: Yes    Birth control/protection: Surgical    Comment: Tubal Ligation  Other Topics Concern   Not on file  Social History Narrative   Not on file   Social Determinants of Health   Financial Resource Strain: Not on file  Food Insecurity: Not on file  Transportation Needs: Not on file  Physical Activity: Not on file  Stress: Not on file  Social Connections: Not on file  Intimate  Partner Violence: Not on file    Current Outpatient Medications on File Prior to Visit  Medication Sig Dispense Refill   Ferrous Sulfate (IRON PO) Take 1 tablet by mouth daily.     FLUoxetine (PROZAC) 10 MG capsule TAKE 1 CAPSULE BY MOUTH EVERY DAY 90 capsule 3   leuprolide (LUPRON DEPOT, 33-MONTH,) 11.25 MG injection Inject 11.25 mg into the muscle every 3 (three) months. 1 each 0   No current facility-administered medications on file prior to visit.     No Known Allergies    Review of Systems Constitutional: No recent fever/chills/sweats.  Does report weight gain Respiratory: No recent cough/bronchitis Cardiovascular: No chest pain Gastrointestinal: No recent nausea/vomiting/diarrhea Genitourinary: No UTI symptoms Hematologic/lymphatic:No history of coagulopathy or recent blood thinner use  Psychiatric: Reports being more emotional lately.  Denies any recent changes in home or work life.  Objective:   Blood pressure 136/87, pulse 65, height '5\' 3"'$  (1.6 m), weight 174 lb (78.9 kg), last menstrual  period 09/12/2022. CONSTITUTIONAL: Well-developed, well-nourished female in no acute distress.  HENT:  Normocephalic, atraumatic, External right and left ear normal. Oropharynx is clear and moist EYES: Conjunctivae and EOM are normal. Pupils are equal, round, and reactive to light. No scleral icterus.  NECK: Normal range of motion, supple, no masses SKIN: Skin is warm and dry. No rash noted. Not diaphoretic. No erythema. No pallor. NEUROLOGIC: Alert and oriented to person, place, and time. Normal reflexes, muscle tone coordination. No cranial nerve deficit noted. PSYCHIATRIC: Normal mood and affect. Normal behavior. Normal judgment and thought content. CARDIOVASCULAR: Normal heart rate noted, regular rhythm RESPIRATORY: Effort and breath sounds normal, no problems with respiration noted ABDOMEN: normal findings: no organomegaly and soft, non-tender and abnormal findings:  mass, located arising from the pelvis, measuring 3-4 fingerbreadths below umbilicus.   PELVIC: Deferred MUSCULOSKELETAL: Normal range of motion. No edema and no tenderness. 2+ distal pulses.    Labs: No results found for this or any previous visit (from the past 336 hour(s)).   Pathology:  A. ENDOMETRIUM, BIOPSY (performed 01/2022):  Late secretory endometrium.  Negative for hyperplasia, malignancy, polyp and endometritis  Imaging Studies: Ultrasound performed earlier today pending.    US PELVIC COMPLETE WITH TRANSVAGINAL Patient Name: Julie Ochoa DOB: 1981/07/10 MRN: [id number] LMP: 04/25/2022  ULTRASOUND REPORT  Location: [facility] Date of Service: 07/10/2022   Indications: f/u fibroids Findings:  The uterus is enlarged, anteverted and measures 12.5 x 9.7 x 7.3 cm. Echo texture is heterogenous with evidence of focal masses. Within the uterus are multiple suspected fibroids. The previously measured  fibroids from 01/24/22 now measure: Fibroid 1: Subserosal, 6.0 x 6.5 x 5.6  cm Fibroid 2: Submucosal, 1.9 x 1.7 x 2.0 cm Fibroid 3: Submucosal, 1.4 x 1.3 x 1.5 cm *All slightly decreased in size*  The Endometrium measures 6.3 mm.  Right Ovary measures 2.5 x 3.7 x 1.8 cm. It is normal in appearance. Left Ovary measures 2.0 x2.0 x 1.7 cm. It is normal in appearance. Survey of the adnexa demonstrates no adnexal masses. There is no free fluid in the cul de sac.  Impression: 1. Enlarged, fibroid uterus 2. Fibroids measured from previous ultrasound appear to be slightly  smaller on today's study  Recommendations: 1.Clinical correlation with the patient's History and Physical Exam.  Edwena Bunde, RDMS, RVT  The ultrasound images and findings were reviewed by me and I agree with  the above report.  Finis Bud, M.D. 07/16/2022 6:56 PM  Assessment:    1. Intramural, submucous, and subserous leiomyoma of uterus   2. History of anemia   3. Menorrhagia with regular cycle   4. Use of leuprolide acetate (Lupron)   5. Family history of ovarian cancer   6. Mixed anxiety and depressive disorder     Plan:   1. Intramural, submucous, and subserous leiomyoma of uterus Patient desires definitive management with hysterectomy.  I proposed doing a robotic total laparoscopic hysterectomy and prophylactic bilateral salpingectomy.  Possible indication for oophorectomy due to family history.  Patient agrees with this proposed surgery.  The risks of surgery were discussed in detail with the patient including but not limited to: bleeding which may require transfusion or reoperation; infection which may require antibiotics; injury to bowel, bladder, ureters or other surrounding organs; need for additional procedures including laparotomy or subsequent procedures secondary to abnormal pathology; formation of adhesions; thromboembolic phenomenon; incisional problems and other postoperative/anesthesia complications.  Patient was also advised that she will remain in house  for 1-2 nights; and expected recovery time after a hysterectomy is 6-8 weeks.  Patient was told that the likelihood that her condition and symptoms will be treated effectively with this surgical management was very high; the postoperative expectations were also discussed in detail. The patient also understands the alternative treatment options which were discussed in full. All questions were answered.  She was told that she will be contacted by our surgical scheduler regarding the time and date of her surgery; routine preoperative instructions will be given to her by the preoperative nursing team. Routine postoperative instructions will be reviewed with the patient in detail after surgery.  Patient instructed to be n.p.o. after midnight on the evening prior to her surgery.  Preoperative testing ordered today.  Surgery scheduled for 11/11/2022.  2. History of anemia - Likely has improved with use of Depo-Lupron injections as patient was amenorrheic with use of the medication.  Will check for preop laboratory testing.  3. Menorrhagia with regular cycle Desires tentative management with hysterectomy, see above #1.  4. Use of leuprolide acetate (Lupron) -Has recently received last dosing of Lupron therapy last month.  Advised that this will likely continue to keep her amenorrheic until her surgery.  5. Family history of ovarian cancer -Patient with a family history of cancer, most significant is the history of ovarian cancer in her grandmother at age 50.  Unclear if patient has ever had hereditary cancer screening.  Can offer testing.  At this time patient does not desire to go into trickle menopause.  6. Mixed anxiety and depressive disorder -Patient noting being more emotional lately.  Asked if this was related to her plans for upcoming hysterectomy.  Patient notes that she does not think it is related to this.  Just has been emotional overall lately.  Currently taking Prozac.  I discussed the option of  increasing her Prozac dose for 1 week (changing from 10 to 20 mg) to see if this will help with her symptoms.  If it does, will likely need a more permanent dose change.       Rubie Maid, MD [facility] at [facility]

## 2022-10-22 NOTE — Progress Notes (Deleted)
    OBSTETRICS/GYNECOLOGY POST-OPERATIVE CLINIC VISIT  Subjective:     JASLEENE JANSON is a 42 y.o. female who presents to the clinic {1-10:13787} weeks status post {gyn surgeries:13997} for {gyn surg indications maj:13998}. Eating a regular diet {with-without:5700} difficulty. Bowel movements are {normal/abnormal***:19619}. {pain control:13522::"The patient is not having any pain."}  {Common ambulatory SmartLinks:19316}  Review of Systems {ros; complete:30496}   Objective:   There were no vitals taken for this visit. There is no height or weight on file to calculate BMI.  General:  alert and no distress  Abdomen: soft, bowel sounds active, non-tender  Incision:   {incision:13716::"no dehiscence","incision well approximated","healing well","no drainage","no erythema","no hernia","no seroma","no swelling"}    Pathology:    Assessment:   Patient s/p *** (surgery)  {doing well:13525::"Doing well postoperatively."}   Plan:   1. Continue any current medications as instructed by provider. 2. Wound care discussed. 3. Operative findings again reviewed. Pathology report discussed. 4. Activity restrictions: {restrictions:13723} 5. Anticipated return to work: {work return:14002}. 6. Follow up: [id number] {time; units:18646} for ***    Landis Gandy, [facility]

## 2022-10-23 ENCOUNTER — Ambulatory Visit: Payer: No Typology Code available for payment source | Admitting: Obstetrics and Gynecology

## 2022-10-23 ENCOUNTER — Encounter: Payer: Self-pay | Admitting: Obstetrics and Gynecology

## 2022-10-23 ENCOUNTER — Ambulatory Visit
Admission: RE | Admit: 2022-10-23 | Discharge: 2022-10-23 | Disposition: A | Discharge status: Home / Self Care | Payer: No Typology Code available for payment source | Source: Ambulatory Visit | Attending: Obstetrics and Gynecology | Admitting: Obstetrics and Gynecology

## 2022-10-23 ENCOUNTER — Other Ambulatory Visit: Payer: No Typology Code available for payment source

## 2022-10-23 VITALS — BP 136/87 | HR 65 | Ht 63.0 in | Wt 174.0 lb

## 2022-10-23 DIAGNOSIS — N921 Excessive and frequent menstruation with irregular cycle: Secondary | ICD-10-CM

## 2022-10-23 DIAGNOSIS — D252 Subserosal leiomyoma of uterus: Secondary | ICD-10-CM | POA: Insufficient documentation

## 2022-10-23 DIAGNOSIS — Z79818 Long term (current) use of other agents affecting estrogen receptors and estrogen levels: Secondary | ICD-10-CM | POA: Diagnosis present

## 2022-10-23 DIAGNOSIS — D250 Submucous leiomyoma of uterus: Secondary | ICD-10-CM

## 2022-10-23 DIAGNOSIS — Z862 Personal history of diseases of the blood and blood-forming organs and certain disorders involving the immune mechanism: Secondary | ICD-10-CM

## 2022-10-23 DIAGNOSIS — D251 Intramural leiomyoma of uterus: Secondary | ICD-10-CM | POA: Diagnosis not present

## 2022-10-23 DIAGNOSIS — F418 Other specified anxiety disorders: Secondary | ICD-10-CM

## 2022-10-23 DIAGNOSIS — Z8041 Family history of malignant neoplasm of ovary: Secondary | ICD-10-CM

## 2022-10-23 DIAGNOSIS — N920 Excessive and frequent menstruation with regular cycle: Secondary | ICD-10-CM

## 2022-10-23 NOTE — Patient Instructions (Signed)
GYNECOLOGY PRE-OPERATIVE INSTRUCTIONS  You are scheduled for surgery on 11/11/2022.  The name of your procedure is: Total hysterectomy (robotic-assisted) with bilateral salpingectomy.   Please read through these instructions carefully regarding preparation for your surgery: Nothing to eat after midnight on the day prior to surgery.  Do not take any medications unless recommended by your provider on day prior to surgery.  Do not take NSAIDs (Motrin, Aleve) or aspirin 7 days prior to surgery.  You may take Tylenol products for minor aches and pains.  You will receive a prescription for pain medications post-operatively.  You will be contacted by phone approximately 1-2 weeks prior to surgery to schedule your pre-operative appointment.  Please call the office if you have any questions regarding your upcoming surgery.    Thank you for choosing [facility] at [facility].    Robotic Assisted Hysterectomy  This surgical method provides a high-powered 3-D view of the operating area, permits an extensive range of motion that is more precise the human hand and allows use of surgical instruments from angles and positions that would be difficult to otherwise achieve.  OVERVIEW What is robotic assisted hysterectomy? Robotic assisted hysterectomy is a type of surgery that uses surgeon-controlled robotic equipment to remove your uterus.  Hysterectomy is the surgical removal of the uterus. The uterus is a hollow, muscular organ located in a woman's pelvis. Having a hysterectomy ends menstruation and the ability to become pregnant. Depending on the reason for the surgery, a hysterectomy may also involve the removal of other organs and tissues, such as the ovaries and/or fallopian tubes.  A robotic assisted surgery system consists of two separate pieces of equipment. One robotic piece of equipment is located next to the patient in the operating room. This robotic piece has four arms, which are long thin  tubes that are attached to either a thin surgical instrument or a tiny camera. The surgical instruments and camera enter the patient's body through small  inch cuts (incisions) in the abdomen.  A short distance away from the operating table, the surgeon is seated in front of a separate computerized piece of equipment that looks like a video game. The surgeon controls the movements of the robotic arms and instruments with hand-held controls. The surgeon looks through binocular-like lenses on the equipment and a computer generates a three-dimensional view of the operating area. Foot pedals control the camera and allow the surgeon to zoom in or out to change the surgical view.  In what ways does robotic assisted surgery help the surgeon? The robotic assisted surgery is a computer-enhanced surgical system that gives surgeons the advantages of:  A 3-D view of the surgical field, including depth, up to 15 times the magnification and high resolution Instruments that mimic the movement of the human hands, wrists and fingers, allowing an extensive range of motion that is more precise than the surgeon's natural hand and wrist movements A constant steadiness of the robot arms and instruments and robot wrists that make it easier for surgeons to operate on organs and tissues for long periods of time and from angles and positions they would have difficulty reaching with human hands and fingers The surgeon controls every precise movement of the robotic arms and instruments. The robotic arms cannot move on their own.  Who may benefit from robotic-assisted hysterectomy? Robotic assisted hysterectomy may be especially helpful in:  Patients who are obese Patients who have endometrial cancer Patients who have complex surgical cases, such as advanced stage endometriosis  or pelvic adhesive disease (scar tissue that binds nearby organs together) You and your surgeon will discuss if robotic assisted surgery is possible  and appropriate for your specific condition.  PROCEDURE DETAILS To perform robotic-assisted surgery, a surgeon is seated in front of a computerized console that provides a high-powered view of the operating area. Movement of robotic arms attached to surgical instruments is controlled by the surgeon seated at the console a few feet away. Typical operating room setup for robotic assisted surgery. The surgeon is seated seated in front of a computerized console that provides a high-powered, 3-D view of the operating area. The surgical instruments, attached to robotic arms, are controlled by the surgeon seated at the console. What happens before and during robotic assisted hysterectomy? Before the procedure  Before your surgery, your doctor will perform a physical exam, order blood and urine tests and may order other tests to check your general health. Your doctor will tell you which of your current medications can continue to be taken and which will need to be temporarily stopped before surgery. You will be given instructions on when to stop eating and drinking the evening before and morning of your surgery. Your surgeon will explain the procedure in detail, including possible complications and side effects. He or she will also answer your questions.  On the day of surgery:  A urinary catheter may be inserted to empty your bladder Your abdominal area will be cleaned with a sterile solution An intravenous (IV) line will be placed in a vein in your arm to deliver medications and fluids During the procedure  After receiving anesthesia, your surgeon will make four or five small surgical cuts (incisions) in your abdomen (belly). The thin surgical instruments and tiny lighted camera attached to the arms of the surgical robot are inserted into the abdomen through these incisions.  The surgeon controls the precise movement of the robotic arms, surgical instruments and camera while seated at a computer  console. Members of the surgical team stand next to the operating table to change the robotic instruments and provide other assistance to the surgeon as needed.  Your surgeon typically removes the uterus through the vagina, like when delivering a baby. In certain cases, the uterus is removed through the small incisions in your abdomen.  An anesthesiologist monitors your anesthesia and vital signs throughout your operation.  How long does robotic assisted hysterectomy take to complete? Robotic assisted hysterectomy typically takes between one to four hours to complete, depending upon the surgeon and the complexity of the case.  What's the typical recovery time with robotic assisted hysterectomy? Robotic hysterectomy is an outpatient procedure. You may stay in the hospital overnight but some woman can be released the same day of surgery. You will able return to light regular activities the next day (walking, eating, walking up stairs). You can drive in about a week or less at the discretion of your physician and return to exercising in about four to six weeks. Your doctor will review your progress and tell you when you can return to your normal activities.  RISKS / BENEFITS What are the advantages of robot-assisted surgery for patients compared with traditional open surgery? Compared with traditional open surgery, the benefits of robotic assisted surgery may include:  Less blood loss during surgery Smaller incisions with less scarring. Surgery is performed through small incisions instead of the large incision of open surgery Less post-op pain Decreased risk of infection Shorter hospital stay Shorter recovery time and quicker  return to previous activities. You can usually resume normal activities as soon as you feel up to it. What are the risks of robotic hysterectomy? Robot-assisted hysterectomy takes more time compared to other hysterectomy methods, such as traditional open hysterectomy  performed by a surgeon. Longer surgeries may increase your risk for complications.  Like any surgical procedure, robotic hysterectomy carries risks including:  Bleeding Damage to the bladder and other nearby organs Infection Reaction to anesthesia Blood clots that form in the legs and can travel to your lungs  RECOVERY AND OUTLOOK What is the prognosis (outlook) for people who have robotic hysterectomy? Most women recover from robotic hysterectomy in less time and with less pain compared to traditional, open hysterectomies. Because the incisions are small, people can return to their daily activities more quickly. With the exception of hysterectomy for cervical cancer, outcomes with robotic surgery are as good as open surgery with shorter recovery. Robotic surgery is NOT recommended for hysterectomies done for cervical cancer as cancer-related outcomes are significantly worse.

## 2022-11-06 ENCOUNTER — Encounter
Admission: RE | Admit: 2022-11-06 | Discharge: 2022-11-06 | Disposition: A | Discharge status: Home / Self Care | Payer: No Typology Code available for payment source | Source: Ambulatory Visit | Attending: Obstetrics and Gynecology | Admitting: Obstetrics and Gynecology

## 2022-11-06 ENCOUNTER — Other Ambulatory Visit: Payer: Self-pay | Admitting: Obstetrics and Gynecology

## 2022-11-06 VITALS — Ht 63.0 in | Wt 172.0 lb

## 2022-11-06 DIAGNOSIS — Z01818 Encounter for other preprocedural examination: Secondary | ICD-10-CM

## 2022-11-06 DIAGNOSIS — Z01812 Encounter for preprocedural laboratory examination: Secondary | ICD-10-CM

## 2022-11-06 HISTORY — DX: Leiomyoma of uterus, unspecified: D25.9

## 2022-11-06 HISTORY — DX: Anemia, unspecified: D64.9

## 2022-11-06 NOTE — Patient Instructions (Addendum)
Your procedure is scheduled on: Monday, March 18 Report to the Registration Desk on the 1st floor of the [facility]. To find out your arrival time, please call [phone] between 1PM - 3PM on: Friday, March 15 If your arrival time is 6:00 am, do not arrive before that time as the [facility] entrance doors do not open until 6:00 am.  REMEMBER: Instructions that are not followed completely may result in serious medical risk, up to and including death; or upon the discretion of your surgeon and anesthesiologist your surgery may need to be rescheduled.  Do not eat food after midnight the night before surgery.  No gum chewing or hard candies.  You may however, drink CLEAR liquids up to 2 hours before you are scheduled to arrive for your surgery. Do not drink anything within 2 hours of your scheduled arrival time.  Clear liquids include: - water  - apple juice without pulp - gatorade (not RED colors) - black coffee or tea (Do NOT add milk or creamers to the coffee or tea) Do NOT drink anything that is not on this list.  In addition, your doctor has ordered for you to drink the provided:  Ensure Pre-Surgery Clear Carbohydrate Drink  Drinking this carbohydrate drink up to two hours before surgery helps to reduce insulin resistance and improve patient outcomes. Please complete drinking 2 hours before scheduled arrival time.  One week prior to surgery: starting today, March 13 Stop Anti-inflammatories (NSAIDS) such as Advil, Aleve, Ibuprofen, Motrin, Naproxen, Naprosyn and Aspirin based products such as Excedrin, Goody's Powder, BC Powder. Stop ANY OVER THE COUNTER supplements until after surgery. Stop over the counter iron. You may however, continue to take Tylenol if needed for pain up until the day of surgery.  Continue taking all prescribed medications  TAKE ONLY THESE MEDICATIONS THE MORNING OF SURGERY WITH A SIP OF WATER:  Fluoxetine (Prozac)  No Alcohol for 24 hours before or  after surgery.  No Smoking including e-cigarettes for 24 hours before surgery.  No chewable tobacco products for at least 6 hours before surgery.  No nicotine patches on the day of surgery.  Do not use any "recreational" drugs for at least a week (preferably 2 weeks) before your surgery.  Please be advised that the combination of cocaine and anesthesia may have negative outcomes, up to and including death. If you test positive for cocaine, your surgery will be cancelled.  On the morning of surgery brush your teeth with toothpaste and water, you may rinse your mouth with mouthwash if you wish. Do not swallow any toothpaste or mouthwash.  Use CHG Soap as directed on instruction sheet.  Do not wear jewelry, make-up, hairpins, clips or nail polish.  Do not wear lotions, powders, or perfumes.   Do not shave body hair from the neck down 48 hours before surgery.  Contact lenses, hearing aids and dentures may not be worn into surgery.  Do not bring valuables to the hospital. [facility] is not responsible for any missing/lost belongings or valuables.   Notify your doctor if there is any change in your medical condition (cold, fever, infection).  Wear comfortable clothing (specific to your surgery type) to the hospital.  After surgery, you can help prevent lung complications by doing breathing exercises.  Take deep breaths and cough every 1-2 hours. Your doctor may order a device called an Incentive Spirometer to help you take deep breaths. When coughing or sneezing, hold a pillow firmly against your incision  with both hands. This is called "splinting." Doing this helps protect your incision. It also decreases belly discomfort.  If you are being discharged the day of surgery, you will not be allowed to drive home. You will need a responsible individual to drive you home and stay with you for 24 hours after surgery.   If you are taking public transportation, you will need to have a  responsible individual with you.  Please call the [facility] at [phone] if you have any questions about these instructions.  Surgery Visitation Policy:  Patients undergoing a surgery or procedure may have two family members or support persons with them as long as the person is not COVID-19 positive or experiencing its symptoms.      Preparing for Surgery with CHLORHEXIDINE GLUCONATE (CHG) Soap  Chlorhexidine Gluconate (CHG) Soap  o An antiseptic cleaner that kills germs and bonds with the skin to continue killing germs even after washing  o Used for showering the night before surgery and morning of surgery  Before surgery, you can play an important role by reducing the number of germs on your skin.  CHG (Chlorhexidine gluconate) soap is an antiseptic cleanser which kills germs and bonds with the skin to continue killing germs even after washing.  Please do not use if you have an allergy to CHG or antibacterial soaps. If your skin becomes reddened/irritated stop using the CHG.  1. Shower the NIGHT BEFORE SURGERY and the MORNING OF SURGERY with CHG soap.  2. If you choose to wash your hair, wash your hair first as usual with your normal shampoo.  3. After shampooing, rinse your hair and body thoroughly to remove the shampoo.  4. Use CHG as you would any other liquid soap. You can apply CHG directly to the skin and wash gently with a scrungie or a clean washcloth.  5. Apply the CHG soap to your body only from the neck down. Do not use on open wounds or open sores. Avoid contact with your eyes, ears, mouth, and genitals (private parts). Wash face and genitals (private parts) with your normal soap.  6. Wash thoroughly, paying special attention to the area where your surgery will be performed.  7. Thoroughly rinse your body with warm water.  8. Do not shower/wash with your normal soap after using and rinsing off the CHG soap.  9. Pat yourself dry with a  clean towel.  10. Wear clean pajamas to bed the night before surgery.  12. Place clean sheets on your bed the night of your first shower and do not sleep with pets.  13. Shower again with the CHG soap on the day of surgery prior to arriving at the hospital.  14. Do not apply any deodorants/lotions/powders.  15. Please wear clean clothes to the hospital.

## 2022-11-07 ENCOUNTER — Encounter
Admission: RE | Admit: 2022-11-07 | Discharge: 2022-11-07 | Disposition: A | Discharge status: Home / Self Care | Payer: No Typology Code available for payment source | Source: Ambulatory Visit | Attending: Obstetrics and Gynecology | Admitting: Obstetrics and Gynecology

## 2022-11-07 DIAGNOSIS — Z01818 Encounter for other preprocedural examination: Secondary | ICD-10-CM | POA: Insufficient documentation

## 2022-11-07 LAB — COMPREHENSIVE METABOLIC PANEL
ALT: 22 U/L (ref 0–44)
AST: 31 U/L (ref 15–41)
Albumin: 4.2 g/dL (ref 3.5–5.0)
Alkaline Phosphatase: 48 U/L (ref 38–126)
Anion gap: 8 (ref 5–15)
BUN: 8 mg/dL (ref 6–20)
CO2: 27 mmol/L (ref 22–32)
Calcium: 9.7 mg/dL (ref 8.9–10.3)
Chloride: 106 mmol/L (ref 98–111)
Creatinine, Ser: 0.83 mg/dL (ref 0.44–1.00)
GFR, Estimated: >60 mL/min (ref >60)
Glucose, Bld: 88 mg/dL (ref 70–99)
Potassium: 4.2 mmol/L (ref 3.5–5.1)
Sodium: 141 mmol/L (ref 135–145)
Total Bilirubin: 0.6 mg/dL (ref 0.3–1.2)
Total Protein: 7.8 g/dL (ref 6.5–8.1)

## 2022-11-07 LAB — CBC
HCT: 42.1 % (ref 36.0–46.0)
Hemoglobin: 13.6 g/dL (ref 12.0–15.0)
MCH: 28.1 pg (ref 26.0–34.0)
MCHC: 32.3 g/dL (ref 30.0–36.0)
MCV: 87 fL (ref 80.0–100.0)
Platelets: 263 10*3/uL (ref 150–400)
RBC: 4.84 MIL/uL (ref 3.87–5.11)
RDW: 13 % (ref 11.5–15.5)
WBC: 4.7 10*3/uL (ref 4.0–10.5)
nRBC: 0 % (ref 0.0–0.2)

## 2022-11-07 LAB — RAPID HIV SCREEN (HIV 1/2 AB+AG)
HIV 1/2 Antibodies: NONREACTIVE
HIV-1 P24 Antigen - HIV24: NONREACTIVE

## 2022-11-07 LAB — HEPATITIS C ANTIBODY: HCV Ab: NONREACTIVE

## 2022-11-10 MED ORDER — CEFAZOLIN SODIUM-DEXTROSE 2-4 GM/100ML-% IV SOLN
2.0000 g | INTRAVENOUS | Status: AC
  Administered 2022-11-11: 2 g via INTRAVENOUS

## 2022-11-10 MED ORDER — GABAPENTIN 300 MG PO CAPS: 300.0000 mg | ORAL_CAPSULE | ORAL | Status: AC

## 2022-11-10 MED ORDER — CELECOXIB 200 MG PO CAPS: 400.0000 mg | ORAL_CAPSULE | ORAL | Status: AC

## 2022-11-10 MED ORDER — ACETAMINOPHEN 500 MG PO TABS: 1000.0000 mg | ORAL_TABLET | ORAL | Status: AC

## 2022-11-10 MED ORDER — ORAL CARE MOUTH RINSE: 15.0000 mL | Freq: Once | OROMUCOSAL | Status: AC

## 2022-11-10 MED ORDER — CHLORHEXIDINE GLUCONATE 0.12 % MT SOLN: 15.0000 mL | Freq: Once | OROMUCOSAL | Status: AC

## 2022-11-10 MED ORDER — LACTATED RINGERS IV SOLN: INTRAVENOUS | Status: DC

## 2022-11-10 MED ORDER — POVIDONE-IODINE 10 % EX SWAB: 2.0000 | Freq: Once | CUTANEOUS | Status: DC

## 2022-11-10 MED ORDER — FAMOTIDINE 20 MG PO TABS: 20.0000 mg | ORAL_TABLET | Freq: Once | ORAL | Status: AC

## 2022-11-11 ENCOUNTER — Ambulatory Visit: Payer: No Typology Code available for payment source | Admitting: Urgent Care

## 2022-11-11 ENCOUNTER — Other Ambulatory Visit: Payer: Self-pay

## 2022-11-11 ENCOUNTER — Ambulatory Visit
Admission: RE | Admit: 2022-11-11 | Discharge: 2022-11-12 | Disposition: A | Discharge status: Home / Self Care | Payer: No Typology Code available for payment source | Attending: Obstetrics and Gynecology | Admitting: Obstetrics and Gynecology

## 2022-11-11 ENCOUNTER — Encounter: Payer: Self-pay | Admitting: Obstetrics and Gynecology

## 2022-11-11 ENCOUNTER — Encounter
Admission: RE | Disposition: A | Discharge status: Home / Self Care | Payer: Self-pay | Source: Home / Self Care | Attending: Obstetrics and Gynecology

## 2022-11-11 ENCOUNTER — Ambulatory Visit: Payer: No Typology Code available for payment source | Admitting: General Practice

## 2022-11-11 DIAGNOSIS — D252 Subserosal leiomyoma of uterus: Secondary | ICD-10-CM | POA: Insufficient documentation

## 2022-11-11 DIAGNOSIS — Z90710 Acquired absence of both cervix and uterus: Secondary | ICD-10-CM | POA: Insufficient documentation

## 2022-11-11 DIAGNOSIS — Z01812 Encounter for preprocedural laboratory examination: Secondary | ICD-10-CM

## 2022-11-11 DIAGNOSIS — D250 Submucous leiomyoma of uterus: Secondary | ICD-10-CM | POA: Diagnosis not present

## 2022-11-11 DIAGNOSIS — D251 Intramural leiomyoma of uterus: Secondary | ICD-10-CM | POA: Insufficient documentation

## 2022-11-11 DIAGNOSIS — F3281 Premenstrual dysphoric disorder: Secondary | ICD-10-CM | POA: Diagnosis not present

## 2022-11-11 DIAGNOSIS — N9961 Intraoperative hemorrhage and hematoma of a genitourinary system organ or structure complicating a genitourinary system procedure: Secondary | ICD-10-CM

## 2022-11-11 DIAGNOSIS — Z8041 Family history of malignant neoplasm of ovary: Secondary | ICD-10-CM | POA: Diagnosis not present

## 2022-11-11 DIAGNOSIS — D259 Leiomyoma of uterus, unspecified: Secondary | ICD-10-CM | POA: Diagnosis not present

## 2022-11-11 DIAGNOSIS — Z01818 Encounter for other preprocedural examination: Secondary | ICD-10-CM

## 2022-11-11 DIAGNOSIS — N920 Excessive and frequent menstruation with regular cycle: Secondary | ICD-10-CM | POA: Diagnosis not present

## 2022-11-11 DIAGNOSIS — D500 Iron deficiency anemia secondary to blood loss (chronic): Secondary | ICD-10-CM | POA: Diagnosis not present

## 2022-11-11 DIAGNOSIS — Z79818 Long term (current) use of other agents affecting estrogen receptors and estrogen levels: Secondary | ICD-10-CM | POA: Insufficient documentation

## 2022-11-11 HISTORY — PX: ROBOTIC ASSISTED TOTAL HYSTERECTOMY WITH BILATERAL SALPINGO OOPHERECTOMY: SHX6086

## 2022-11-11 LAB — HEMOGLOBIN AND HEMATOCRIT, BLOOD
HCT: 23.5 % — ABNORMAL LOW (ref 36.0–46.0)
Hemoglobin: 7.8 g/dL — ABNORMAL LOW (ref 12.0–15.0)

## 2022-11-11 LAB — ABO/RH: ABO/RH(D): A POS

## 2022-11-11 LAB — POCT PREGNANCY, URINE: Preg Test, Ur: NEGATIVE

## 2022-11-11 SURGERY — HYSTERECTOMY, TOTAL, ROBOT-ASSISTED, LAPAROSCOPIC, WITH BILATERAL SALPINGO-OOPHORECTOMY
Anesthesia: General | Site: Uterus | Laterality: Bilateral

## 2022-11-11 MED ORDER — LIDOCAINE HCL (CARDIAC) PF 100 MG/5ML IV SOSY
PREFILLED_SYRINGE | INTRAVENOUS | Status: DC | PRN
  Administered 2022-11-11: 100 mg via INTRAVENOUS

## 2022-11-11 MED ORDER — BUPIVACAINE HCL (PF) 0.5 % IJ SOLN
INTRAMUSCULAR | Status: AC
  Filled 2022-11-11: qty 30

## 2022-11-11 MED ORDER — ALBUMIN HUMAN 5 % IV SOLN: INTRAVENOUS | Status: DC | PRN

## 2022-11-11 MED ORDER — FAMOTIDINE 20 MG PO TABS
ORAL_TABLET | ORAL | Status: AC
  Administered 2022-11-11: 20 mg via ORAL
  Filled 2022-11-11: qty 1

## 2022-11-11 MED ORDER — HEMOSTATIC AGENTS (NO CHARGE) OPTIME
TOPICAL | Status: DC | PRN
  Administered 2022-11-11: 1 via TOPICAL

## 2022-11-11 MED ORDER — PHENYLEPHRINE HCL-NACL 20-0.9 MG/250ML-% IV SOLN
INTRAVENOUS | Status: AC
  Filled 2022-11-11: qty 250

## 2022-11-11 MED ORDER — DOCUSATE SODIUM 100 MG PO CAPS: 100.0000 mg | ORAL_CAPSULE | Freq: Two times a day (BID) | ORAL | 2 refills | Status: AC | PRN

## 2022-11-11 MED ORDER — CHLORHEXIDINE GLUCONATE 0.12 % MT SOLN
OROMUCOSAL | Status: AC
  Administered 2022-11-11: 15 mL via OROMUCOSAL
  Filled 2022-11-11: qty 15

## 2022-11-11 MED ORDER — FENTANYL CITRATE (PF) 100 MCG/2ML IJ SOLN
INTRAMUSCULAR | Status: AC
  Filled 2022-11-11: qty 2

## 2022-11-11 MED ORDER — SUGAMMADEX SODIUM 200 MG/2ML IV SOLN
INTRAVENOUS | Status: DC | PRN
  Administered 2022-11-11: 312 mg via INTRAVENOUS

## 2022-11-11 MED ORDER — PHENYLEPHRINE HCL-NACL 20-0.9 MG/250ML-% IV SOLN
INTRAVENOUS | Status: DC | PRN
  Administered 2022-11-11: 50 ug/min via INTRAVENOUS

## 2022-11-11 MED ORDER — ROCURONIUM BROMIDE 10 MG/ML (PF) SYRINGE
PREFILLED_SYRINGE | INTRAVENOUS | Status: AC
  Filled 2022-11-11: qty 10

## 2022-11-11 MED ORDER — FERROUS SULFATE 325 (65 FE) MG PO TBEC: 325.0000 mg | DELAYED_RELEASE_TABLET | Freq: Two times a day (BID) | ORAL | 2 refills | Status: DC

## 2022-11-11 MED ORDER — OXYCODONE HCL 5 MG PO TABS
5.0000 mg | ORAL_TABLET | ORAL | Status: DC | PRN
  Administered 2022-11-11: 10 mg via ORAL
  Administered 2022-11-11: 5 mg via ORAL
  Administered 2022-11-12 (×2): 10 mg via ORAL
  Filled 2022-11-11: qty 1
  Filled 2022-11-11 (×3): qty 2

## 2022-11-11 MED ORDER — FENTANYL CITRATE (PF) 100 MCG/2ML IJ SOLN: 25.0000 ug | INTRAMUSCULAR | Status: DC | PRN

## 2022-11-11 MED ORDER — CEFAZOLIN SODIUM-DEXTROSE 2-4 GM/100ML-% IV SOLN
INTRAVENOUS | Status: AC
  Filled 2022-11-11: qty 100

## 2022-11-11 MED ORDER — EPHEDRINE 5 MG/ML INJ
INTRAVENOUS | Status: AC
  Filled 2022-11-11: qty 5

## 2022-11-11 MED ORDER — GABAPENTIN 300 MG PO CAPS
ORAL_CAPSULE | ORAL | Status: AC
  Administered 2022-11-11: 300 mg via ORAL
  Filled 2022-11-11: qty 1

## 2022-11-11 MED ORDER — LACTATED RINGERS IV SOLN: INTRAVENOUS | Status: DC

## 2022-11-11 MED ORDER — OXYCODONE-ACETAMINOPHEN 5-325 MG PO TABS: 1.0000 | ORAL_TABLET | Freq: Four times a day (QID) | ORAL | 0 refills | Status: AC | PRN

## 2022-11-11 MED ORDER — BUPIVACAINE HCL 0.5 % IJ SOLN
INTRAMUSCULAR | Status: DC | PRN
  Administered 2022-11-11: 30 mL

## 2022-11-11 MED ORDER — 0.9 % SODIUM CHLORIDE (POUR BTL) OPTIME
TOPICAL | Status: DC | PRN
  Administered 2022-11-11: 500 mL

## 2022-11-11 MED ORDER — MENTHOL 3 MG MT LOZG: 1.0000 | LOZENGE | OROMUCOSAL | Status: DC | PRN

## 2022-11-11 MED ORDER — PHENYLEPHRINE 80 MCG/ML (10ML) SYRINGE FOR IV PUSH (FOR BLOOD PRESSURE SUPPORT)
PREFILLED_SYRINGE | INTRAVENOUS | Status: AC
  Filled 2022-11-11: qty 10

## 2022-11-11 MED ORDER — MIDAZOLAM HCL 2 MG/2ML IJ SOLN
INTRAMUSCULAR | Status: AC
  Filled 2022-11-11: qty 2

## 2022-11-11 MED ORDER — FENTANYL CITRATE (PF) 100 MCG/2ML IJ SOLN
INTRAMUSCULAR | Status: DC | PRN
  Administered 2022-11-11: 100 ug via INTRAVENOUS

## 2022-11-11 MED ORDER — SEVOFLURANE IN SOLN
RESPIRATORY_TRACT | Status: AC
  Filled 2022-11-11: qty 250

## 2022-11-11 MED ORDER — EPHEDRINE SULFATE (PRESSORS) 50 MG/ML IJ SOLN
INTRAMUSCULAR | Status: DC | PRN
  Administered 2022-11-11: 5 mg via INTRAVENOUS
  Administered 2022-11-11 (×3): 10 mg via INTRAVENOUS

## 2022-11-11 MED ORDER — ALBUMIN HUMAN 5 % IV SOLN
INTRAVENOUS | Status: AC
  Filled 2022-11-11: qty 500

## 2022-11-11 MED ORDER — IBUPROFEN 600 MG PO TABS: 600.0000 mg | ORAL_TABLET | Freq: Four times a day (QID) | ORAL | 3 refills | Status: AC | PRN

## 2022-11-11 MED ORDER — DOCUSATE SODIUM 100 MG PO CAPS
100.0000 mg | ORAL_CAPSULE | Freq: Two times a day (BID) | ORAL | Status: DC
  Administered 2022-11-11 – 2022-11-12 (×2): 100 mg via ORAL
  Filled 2022-11-11 (×2): qty 1

## 2022-11-11 MED ORDER — ROCURONIUM BROMIDE 100 MG/10ML IV SOLN
INTRAVENOUS | Status: DC | PRN
  Administered 2022-11-11 (×2): 20 mg via INTRAVENOUS
  Administered 2022-11-11 (×2): 10 mg via INTRAVENOUS
  Administered 2022-11-11: 50 mg via INTRAVENOUS

## 2022-11-11 MED ORDER — PROPOFOL 10 MG/ML IV BOLUS
INTRAVENOUS | Status: DC | PRN
  Administered 2022-11-11: 150 mg via INTRAVENOUS

## 2022-11-11 MED ORDER — ALUM & MAG HYDROXIDE-SIMETH 200-200-20 MG/5ML PO SUSP: 30.0000 mL | ORAL | Status: DC | PRN

## 2022-11-11 MED ORDER — ACETAMINOPHEN 500 MG PO TABS
ORAL_TABLET | ORAL | Status: AC
  Administered 2022-11-11: 1000 mg via ORAL
  Filled 2022-11-11: qty 2

## 2022-11-11 MED ORDER — SIMETHICONE 80 MG PO CHEW: 80.0000 mg | CHEWABLE_TABLET | Freq: Four times a day (QID) | ORAL | 1 refills | Status: DC | PRN

## 2022-11-11 MED ORDER — ACETAMINOPHEN 500 MG PO TABS
1000.0000 mg | ORAL_TABLET | Freq: Four times a day (QID) | ORAL | Status: DC
  Administered 2022-11-11 – 2022-11-12 (×3): 1000 mg via ORAL
  Filled 2022-11-11 (×3): qty 2

## 2022-11-11 MED ORDER — OXYCODONE HCL 5 MG/5ML PO SOLN: 5.0000 mg | Freq: Once | ORAL | Status: AC | PRN

## 2022-11-11 MED ORDER — OXYCODONE HCL 5 MG PO TABS
ORAL_TABLET | ORAL | Status: AC
  Administered 2022-11-11: 5 mg via ORAL
  Filled 2022-11-11: qty 1

## 2022-11-11 MED ORDER — PROPOFOL 10 MG/ML IV BOLUS
INTRAVENOUS | Status: AC
  Filled 2022-11-11: qty 20

## 2022-11-11 MED ORDER — MIDAZOLAM HCL 2 MG/2ML IJ SOLN
INTRAMUSCULAR | Status: DC | PRN
  Administered 2022-11-11: 2 mg via INTRAVENOUS

## 2022-11-11 MED ORDER — OXYCODONE HCL 5 MG PO TABS: 5.0000 mg | ORAL_TABLET | Freq: Once | ORAL | Status: AC | PRN

## 2022-11-11 MED ORDER — ONDANSETRON HCL 4 MG/2ML IJ SOLN: 4.0000 mg | Freq: Four times a day (QID) | INTRAMUSCULAR | Status: DC | PRN

## 2022-11-11 MED ORDER — SIMETHICONE 80 MG PO CHEW
80.0000 mg | CHEWABLE_TABLET | Freq: Four times a day (QID) | ORAL | Status: DC | PRN
  Administered 2022-11-11: 80 mg via ORAL
  Filled 2022-11-11: qty 1

## 2022-11-11 MED ORDER — DEXAMETHASONE SODIUM PHOSPHATE 10 MG/ML IJ SOLN
INTRAMUSCULAR | Status: AC
  Filled 2022-11-11: qty 1

## 2022-11-11 MED ORDER — ONDANSETRON HCL 4 MG/2ML IJ SOLN
INTRAMUSCULAR | Status: AC
  Filled 2022-11-11: qty 2

## 2022-11-11 MED ORDER — SODIUM CHLORIDE 0.9% IV SOLUTION: Freq: Once | INTRAVENOUS | Status: DC

## 2022-11-11 MED ORDER — PHENYLEPHRINE HCL (PRESSORS) 10 MG/ML IV SOLN
INTRAVENOUS | Status: DC | PRN
  Administered 2022-11-11 (×9): 160 ug via INTRAVENOUS

## 2022-11-11 MED ORDER — ONDANSETRON HCL 4 MG PO TABS: 4.0000 mg | ORAL_TABLET | Freq: Four times a day (QID) | ORAL | Status: DC | PRN

## 2022-11-11 MED ORDER — DEXAMETHASONE SODIUM PHOSPHATE 10 MG/ML IJ SOLN
INTRAMUSCULAR | Status: DC | PRN
  Administered 2022-11-11: 10 mg via INTRAVENOUS

## 2022-11-11 MED ORDER — CELECOXIB 200 MG PO CAPS
ORAL_CAPSULE | ORAL | Status: AC
  Administered 2022-11-11: 400 mg via ORAL
  Filled 2022-11-11: qty 2

## 2022-11-11 SURGICAL SUPPLY — 91 items
ADH SKN CLS APL DERMABOND .7 (GAUZE/BANDAGES/DRESSINGS) ×1
APL SRG 38 LTWT LNG FL B (MISCELLANEOUS) ×1
APPLICATOR ARISTA FLEXITIP XL (MISCELLANEOUS) IMPLANT
BAG DRN RND TRDRP ANRFLXCHMBR (UROLOGICAL SUPPLIES) ×1
BAG URINE DRAIN 2000ML AR STRL (UROLOGICAL SUPPLIES) ×1 IMPLANT
BLADE SURG SZ11 CARB STEEL (BLADE) ×1 IMPLANT
CANNULA CAP OBTURATR AIRSEAL 8 (CAP) IMPLANT
CATH FOLEY 2WAY  5CC 16FR (CATHETERS) ×1
CATH FOLEY 2WAY 5CC 16FR (CATHETERS) ×1
CATH URTH 16FR FL 2W BLN LF (CATHETERS) ×1 IMPLANT
COUNTER NEEDLE 20/40 LG (NEEDLE) IMPLANT
COVER TIP SHEARS 8 DVNC (MISCELLANEOUS) ×1 IMPLANT
COVER TIP SHEARS 8MM DA VINCI (MISCELLANEOUS) ×1
COVER WAND RF STERILE (DRAPES) ×1 IMPLANT
DERMABOND ADVANCED .7 DNX12 (GAUZE/BANDAGES/DRESSINGS) ×1 IMPLANT
DRAPE 3/4 80X56 (DRAPES) ×1 IMPLANT
DRAPE ARM DVNC X/XI (DISPOSABLE) ×4 IMPLANT
DRAPE COLUMN DVNC XI (DISPOSABLE) ×1 IMPLANT
DRAPE DA VINCI XI ARM (DISPOSABLE) ×6
DRAPE DA VINCI XI COLUMN (DISPOSABLE) ×1
DRAPE ROBOT W/ LEGGING 30X125 (DRAPES) ×1 IMPLANT
ELECT REM PT RETURN 9FT ADLT (ELECTROSURGICAL) ×1
ELECTRODE REM PT RTRN 9FT ADLT (ELECTROSURGICAL) ×1 IMPLANT
GAUZE 4X4 16PLY ~~LOC~~+RFID DBL (SPONGE) ×2 IMPLANT
GLOVE BIO SURGEON STRL SZ 6.5 (GLOVE) ×4 IMPLANT
GLOVE INDICATOR 7.0 STRL GRN (GLOVE) ×4 IMPLANT
GLOVE PI ORTHO PRO STRL 7.5 (GLOVE) IMPLANT
GOWN STRL REUS W/ TWL LRG LVL3 (GOWN DISPOSABLE) ×4 IMPLANT
GOWN STRL REUS W/TWL LRG LVL3 (GOWN DISPOSABLE) ×4
GRASPER SUT TROCAR 14GX15 (MISCELLANEOUS) ×1 IMPLANT
GYRUS RUMI II 3.5CM BLUE (DISPOSABLE) ×1
HANDLE YANKAUER SUCT BULB TIP (MISCELLANEOUS) IMPLANT
HEMOSTAT ARISTA ABSORB 1G (HEMOSTASIS) IMPLANT
IRRIGATION STRYKERFLOW (MISCELLANEOUS) IMPLANT
IRRIGATOR STRYKERFLOW (MISCELLANEOUS)
IRRIGATOR SUCT 8 DISP DVNC XI (IRRIGATION / IRRIGATOR) IMPLANT
IRRIGATOR SUCTION 8MM XI DISP (IRRIGATION / IRRIGATOR) ×1
IV NS 1000ML (IV SOLUTION)
IV NS 1000ML BAXH (IV SOLUTION) ×1 IMPLANT
KIT IMAGING PINPOINTPAQ (MISCELLANEOUS) IMPLANT
KIT PINK PAD W/HEAD ARE REST (MISCELLANEOUS) ×1
KIT PINK PAD W/HEAD ARM REST (MISCELLANEOUS) ×1 IMPLANT
LABEL OR SOLS (LABEL) ×1 IMPLANT
MANIFOLD NEPTUNE II (INSTRUMENTS) ×1 IMPLANT
MANIPULATOR VCARE LG CRV RETR (MISCELLANEOUS) IMPLANT
MANIPULATOR VCARE SML CRV RETR (MISCELLANEOUS) IMPLANT
MANIPULATOR VCARE STD CRV RETR (MISCELLANEOUS) IMPLANT
NEEDLE VERESS 14GA 120MM (NEEDLE) ×1 IMPLANT
NS IRRIG 1000ML POUR BTL (IV SOLUTION) ×1 IMPLANT
OBTURATOR OPTICAL STANDARD 8MM (TROCAR) ×1
OBTURATOR OPTICAL STND 8 DVNC (TROCAR) ×1
OBTURATOR OPTICALSTD 8 DVNC (TROCAR) ×1 IMPLANT
OCCLUDER COLPOPNEUMO (BALLOONS) ×1 IMPLANT
PACK GYN LAPAROSCOPIC (MISCELLANEOUS) ×1 IMPLANT
PAD OB MATERNITY 4.3X12.25 (PERSONAL CARE ITEMS) ×1 IMPLANT
PENCIL SMOKE EVACUATOR (MISCELLANEOUS) IMPLANT
PORT ACCESS TROCAR AIRSEAL 12 (TROCAR) IMPLANT
PORT ACCESS TROCAR AIRSEAL 5 (TROCAR) IMPLANT
RUMI II GYRUS 3.5CM BLUE (DISPOSABLE) IMPLANT
SCISSORS METZENBAUM CVD 33 (INSTRUMENTS) IMPLANT
SCRUB CHG 4% DYNA-HEX 4OZ (MISCELLANEOUS) ×1 IMPLANT
SEAL CANN UNIV 5-8 DVNC XI (MISCELLANEOUS) ×3 IMPLANT
SEAL XI 5MM-8MM UNIVERSAL (MISCELLANEOUS) ×4
SEALER VESSEL DA VINCI XI (MISCELLANEOUS) ×1
SEALER VESSEL EXT DVNC XI (MISCELLANEOUS) IMPLANT
SET CYSTO W/LG BORE CLAMP LF (SET/KITS/TRAYS/PACK) IMPLANT
SET TRI-LUMEN FLTR TB AIRSEAL (TUBING) ×1 IMPLANT
SET TUBE FILTERED XL AIRSEAL (SET/KITS/TRAYS/PACK) ×1 IMPLANT
SET TUBE SMOKE EVAC HIGH FLOW (TUBING) IMPLANT
SOL ELECTROSURG ANTI STICK (MISCELLANEOUS) ×1
SOL PREP PVP 2OZ (MISCELLANEOUS) ×1
SOLUTION ELECTROSURG ANTI STCK (MISCELLANEOUS) ×1 IMPLANT
SOLUTION PREP PVP 2OZ (MISCELLANEOUS) ×1 IMPLANT
SURGILUBE 2OZ TUBE FLIPTOP (MISCELLANEOUS) ×1 IMPLANT
SUT DVC VLOC 180 0 12IN GS21 (SUTURE) ×1
SUT MNCRL 4-0 (SUTURE)
SUT MNCRL 4-0 27XMFL (SUTURE)
SUT VIC AB 0 CT1 18XCR BRD 8 (SUTURE) ×1 IMPLANT
SUT VIC AB 0 CT1 8-18 (SUTURE) ×1
SUT VIC AB 2-0 CT1 27 (SUTURE) ×1
SUT VIC AB 2-0 CT1 TAPERPNT 27 (SUTURE) ×1 IMPLANT
SUT VICRYL 0 UR6 27IN ABS (SUTURE) ×1 IMPLANT
SUTURE DVC VLC 180 0 12IN GS21 (SUTURE) ×1 IMPLANT
SUTURE MNCRL 4-0 27XMF (SUTURE) ×1 IMPLANT
SYR 10ML LL (SYRINGE) ×1 IMPLANT
SYR 50ML LL SCALE MARK (SYRINGE) ×1 IMPLANT
TIP UTERINE 6.7X8CM BLUE DISP (MISCELLANEOUS) IMPLANT
TRAP FLUID SMOKE EVACUATOR (MISCELLANEOUS) ×1 IMPLANT
TROCAR Z-THRD FIOS HNDL 11X100 (TROCAR) ×1 IMPLANT
TUBING CONNECTING 10 (TUBING) IMPLANT
WATER STERILE IRR 500ML POUR (IV SOLUTION) ×1 IMPLANT

## 2022-11-11 NOTE — H&P (Signed)
GYNECOLOGY PREOPERATIVE HISTORY AND PHYSICAL   Subjective:  Julie Ochoa is a 42 y.o. [id number] here for definitive surgical management of enlarged, fibroid uterus, PMDD, and menorrhagia.  She has been on Lupron therapy since August 2023 and an attempt to manage her menstrual cycles as well as decreased size of the uterine fibroids in order to be a candidate for laparoscopic method of hysterectomy.  Prior to initiation of the Lupron, her cycles were approximately 5 to 6 days however the first 3 days were very heavy requiring use of super tampon and pad, having to change every 45 minutes.  Patient was also passing silver dollar-size clots and experiencing severe dysmenorrhea.  Had a history of anemia due to her heavy menses.   Proposed surgery: Robotic total laparoscopic hysterectomy with bilateral salpingectomy    Pertinent Gynecological History: Menses:  Amenorrheic when on Lupron, however missed her last dose due to insurance issues which led to a cycle in January  for 11 days.  Has since resumed her Lupron dose and did not have a cycle in February. Patient's last menstrual period was 09/12/2022 (exact date). Contraception: tubal ligation  Last mammogram: normal Date: 01/28/2022 Last pap: normal Date: 05/15/20   Past Medical History:  Diagnosis Date   Anemia    Anxiety    Depression    Family history of ovarian cancer    Uterine fibroid     Past Surgical History:  Procedure Laterality Date   OTHER SURGICAL HISTORY     Tubal Reversal   TUBAL LIGATION      OB History  Gravida Para Term Preterm AB Living  5 3 3   2 2   SAB IAB Ectopic Multiple Live Births  2       2    # Outcome Date GA Lbr Len/2nd Weight Sex Delivery Anes PTL Lv  5 Term           4 SAB           3 SAB           2 Term           1 Term             Family History  Problem Relation Age of Onset   Leukemia Sister    Ovarian cancer Maternal Grandmother        38s   Prostate cancer Paternal  Grandfather    Stomach cancer Maternal Aunt    Breast cancer Cousin    Colon cancer Maternal Uncle     Social History   Socioeconomic History   Marital status: Married    Spouse name: Cindee Salt   Number of children: 2   Years of education: Not on file   Highest education level: Not on file  Occupational History   Not on file  Tobacco Use   Smoking status: Never   Smokeless tobacco: Never  Vaping Use   Vaping Use: Never used  Substance and Sexual Activity   Alcohol use: Yes    Comment: occassional   Drug use: No   Sexual activity: Yes    Birth control/protection: Surgical    Comment: Tubal Ligation  Other Topics Concern   Not on file  Social History Narrative   Not on file   Social Determinants of Health   Financial Resource Strain: Not on file  Food Insecurity: Not on file  Transportation Needs: Not on file  Physical Activity: Not on file  Stress: Not  on file  Social Connections: Not on file  Intimate Partner Violence: Not on file    No current facility-administered medications on file prior to encounter.   Current Outpatient Medications on File Prior to Encounter  Medication Sig Dispense Refill   Ferrous Sulfate (IRON PO) Take 1 tablet by mouth daily.     leuprolide (LUPRON DEPOT, 34-MONTH,) 11.25 MG injection Inject 11.25 mg into the muscle every 3 (three) months. (Patient not taking: Reported on 11/11/2022) 1 each 0     No Known Allergies    Review of Systems Constitutional: No recent fever/chills/sweats.  Does report weight gain Respiratory: No recent cough/bronchitis Cardiovascular: No chest pain Gastrointestinal: No recent nausea/vomiting/diarrhea Genitourinary: No UTI symptoms Hematologic/lymphatic:No history of coagulopathy or recent blood thinner use  Psychiatric: Reports being more emotional lately.  Denies any recent changes in home or work life.  Objective:   Blood pressure 131/80, pulse 78, temperature 98.3 F (36.8 C), temperature source  Temporal, resp. rate 18, height 5\' 3"  (1.6 m), weight 78 kg, last menstrual period 09/12/2022, SpO2 99 %. CONSTITUTIONAL: Well-developed, well-nourished female in no acute distress.  HENT:  Normocephalic, atraumatic, External right and left ear normal. Oropharynx is clear and moist EYES: Conjunctivae and EOM are normal. Pupils are equal, round, and reactive to light. No scleral icterus.  NECK: Normal range of motion, supple, no masses SKIN: Skin is warm and dry. No rash noted. Not diaphoretic. No erythema. No pallor. NEUROLOGIC: Alert and oriented to person, place, and time. Normal reflexes, muscle tone coordination. No cranial nerve deficit noted. PSYCHIATRIC: Normal mood and affect. Normal behavior. Normal judgment and thought content. CARDIOVASCULAR: Normal heart rate noted, regular rhythm RESPIRATORY: Effort and breath sounds normal, no problems with respiration noted ABDOMEN: normal findings: no organomegaly and soft, non-tender and abnormal findings:  mass, located arising from the pelvis, measuring 3-4 fingerbreadths below umbilicus.   PELVIC: Deferred MUSCULOSKELETAL: Normal range of motion. No edema and no tenderness. 2+ distal pulses.    Labs: Results for orders placed or performed during the hospital encounter of 11/11/22 (from the past 336 hour(s))  Pregnancy, urine POC   Collection Time: 11/11/22  6:25 AM  Result Value Ref Range   Preg Test, Ur NEGATIVE NEGATIVE  Results for orders placed or performed during the hospital encounter of 11/07/22 (from the past 336 hour(s))  CBC   Collection Time: 11/07/22  8:44 AM  Result Value Ref Range   WBC 4.7 4.0 - 10.5 K/uL   RBC 4.84 3.87 - 5.11 MIL/uL   Hemoglobin 13.6 12.0 - 15.0 g/dL   HCT 42.1 36.0 - 46.0 %   MCV 87.0 80.0 - 100.0 fL   MCH 28.1 26.0 - 34.0 pg   MCHC 32.3 30.0 - 36.0 g/dL   RDW 13.0 11.5 - 15.5 %   Platelets 263 150 - 400 K/uL   nRBC 0.0 0.0 - 0.2 %  Comprehensive metabolic panel   Collection Time: 11/07/22   8:44 AM  Result Value Ref Range   Sodium 141 135 - 145 mmol/L   Potassium 4.2 3.5 - 5.1 mmol/L   Chloride 106 98 - 111 mmol/L   CO2 27 22 - 32 mmol/L   Glucose, Bld 88 70 - 99 mg/dL   BUN 8 6 - 20 mg/dL   Creatinine, Ser 0.83 0.44 - 1.00 mg/dL   Calcium 9.7 8.9 - 10.3 mg/dL   Total Protein 7.8 6.5 - 8.1 g/dL   Albumin 4.2 3.5 - 5.0 g/dL   AST  31 15 - 41 U/L   ALT 22 0 - 44 U/L   Alkaline Phosphatase 48 38 - 126 U/L   Total Bilirubin 0.6 0.3 - 1.2 mg/dL   GFR, Estimated >60 >60 mL/min   Anion gap 8 5 - 15  Rapid HIV screen (HIV 1/2 Ab+Ag)   Collection Time: 11/07/22  8:44 AM  Result Value Ref Range   HIV-1 P24 Antigen - HIV24 NON REACTIVE NON REACTIVE   HIV 1/2 Antibodies NON REACTIVE NON REACTIVE   Interpretation (HIV Ag Ab)      A non reactive test result means that HIV 1 or HIV 2 antibodies and HIV 1 p24 antigen were not detected in the specimen.  Hepatitis C antibody   Collection Time: 11/07/22  8:44 AM  Result Value Ref Range   HCV Ab NON REACTIVE NON REACTIVE  Type and screen   Collection Time: 11/07/22  8:44 AM  Result Value Ref Range   ABO/RH(D) A POS    Antibody Screen NEG    Sample Expiration 11/21/2022,2359    Extend sample reason      NO TRANSFUSIONS OR PREGNANCY IN THE PAST 3 MONTHS Performed at [facility], [address], [place], [place] [address]      Pathology:  A. ENDOMETRIUM, BIOPSY (performed 01/2022):  Late secretory endometrium.  Negative for hyperplasia, malignancy, polyp and endometritis  Imaging Studies: Ultrasound performed earlier today pending.    US PELVIC COMPLETE WITH TRANSVAGINAL CLINICAL DATA:  Follow-up post Lupron therapy for uterine fibroids, LMP 09/12/2022, [id number]  EXAM: TRANSABDOMINAL AND TRANSVAGINAL ULTRASOUND OF PELVIS  TECHNIQUE: Both transabdominal and transvaginal ultrasound examinations of the pelvis were performed. Transabdominal technique was performed for global imaging of the pelvis including  uterus, ovaries, adnexal regions, and pelvic cul-de-sac. It was necessary to proceed with endovaginal exam following the transabdominal exam to visualize the fibroids, endometrium, and RIGHT ovary.  COMPARISON:  07/10/2022  FINDINGS: Uterus  Measurements: 10.5 x 6.1 x 8.2 cm = volume: 278 mL. Anteverted. Enlarged, heterogeneous, and nodular. Large fibroid posterior upper uterus 5.9 x 4.0 x 5.3 cm, previously 6.0 x 6.5 x 5.6 cm. Intramural leiomyoma LEFT uterus 2.3 x 2.4 x 2.1 cm, previously 2.4 cm. Additional posterior leiomyoma at fundus 4.0 x 4.2 x 3.3 cm, previously 3.8 x 3.2 cm.  Endometrium  Thickness: 5 mm.  Poorly visualized due to leiomyomata.  Right ovary  Measurements: 3.3 x 1.7 x 1.9 cm = volume: 5.6 mL. Normal morphology without mass  Left ovary  Measurements: 2.7 x 1.1 x 2.6 cm = volume: 3.9 mL. Normal morphology without mass  Other findings  No free pelvic fluid or adnexal masses.  IMPRESSION: Multiple uterine leiomyomata.  The largest leiomyomata appear to have decreased in sizes since previous study.  Electronically Signed   By: Lavonia Dana M.D.   On: 10/24/2022 13:59    Assessment:    1. Encounter for preprocedural laboratory examination   2. Preoperative testing     Plan:   1. Intramural, submucous, and subserous leiomyoma of uterus Patient desires definitive management with hysterectomy.  I proposed doing a robotic total laparoscopic hysterectomy and prophylactic bilateral salpingectomy.  Desires to leave ovaries at this time.  Patient agrees with this proposed surgery.  The risks of surgery were discussed in detail with the patient including but not limited to: bleeding which may require transfusion or reoperation; infection which may require antibiotics; injury to bowel, bladder, ureters or other surrounding organs; need for additional procedures including  laparotomy or subsequent procedures secondary to abnormal pathology; formation of  adhesions; thromboembolic phenomenon; incisional problems and other postoperative/anesthesia complications.   Routine postoperative instructions will be reviewed with the patient in detail after surgery.  Has been NPO since midnight.   2. History of anemia - Likely has improved with use of Depo-Lupron injections as patient was amenorrheic with use of the medication.    3. Menorrhagia with regular cycle Desires tentative management with hysterectomy, see above #1.  4. Use of leuprolide acetate (Lupron) -Has recently received last dosing of Lupron therapy last month.  Advised that this will likely continue to keep her amenorrheic until her surgery.  5. Family history of ovarian cancer -Patient with a family history of cancer, most significant is the history of ovarian cancer in her grandmother at age 84.  Unclear if patient has ever had hereditary cancer screening.  Can offer testing.  At this time patient does not desire to go into surgical menopause.     Rubie Maid, MD [facility]

## 2022-11-11 NOTE — Anesthesia Postprocedure Evaluation (Signed)
Anesthesia Post Note  Patient: Julie Ochoa  Procedure(s) Performed: XI ROBOTIC ASSISTED TOTAL LAPAROSCOPIC HYSTERECTOMY WITH BILATERAL SALPINGECTOMY (Bilateral: Uterus)  Patient location during evaluation: PACU Anesthesia Type: General Level of consciousness: awake and alert Pain management: pain level controlled Vital Signs Assessment: post-procedure vital signs reviewed and stable Respiratory status: spontaneous breathing, nonlabored ventilation, respiratory function stable and patient connected to nasal cannula oxygen Cardiovascular status: blood pressure returned to baseline and stable Postop Assessment: no apparent nausea or vomiting Anesthetic complications: no Comments: Patient receiving post op blood transfusion for symptomatic anemia from surgical blood loss   No notable events documented.   Last Vitals:  Vitals:   11/11/22 1330 11/11/22 1415  BP: 99/61 102/62  Pulse: 83 76  Resp: 17 (!) 8  Temp:  36.4 C  SpO2: 98% 100%    Last Pain:  Vitals:   11/11/22 1415  TempSrc: Temporal  PainSc:                  Arita Miss

## 2022-11-11 NOTE — Op Note (Signed)
Procedure(s): XI ROBOTIC ASSISTED TOTAL LAPAROSCOPIC HYSTERECTOMY WITH BILATERAL SALPINGECTOMY Procedure Note  DLISA KALDENBERG female 42 y.o. 11/11/2022  Indications: The patient is a 42 y.o. [id number] female with enlarged fibroid uterus, menorrhagia with regular cycle, history of iron deficienct anemia, s/p therapy with Depot Lupron for total of 6 months. History of prior tubal ligation. Desires definitive management with hysterectomy.   Pre-operative Diagnosis: Enlarged fibroid uterus, menorrhagia with regular cycle, history of iron deficiency anemia  Post-operative Diagnosis: Same, with intra-operative hemorrhage  Surgeon: Rubie Maid, MD  Assistants:  Surgical CFNA .   Anesthesia: General endotracheal anesthesia  Findings: The uterus was sounded to 9 cm. Exam under anesthesia notes uterus palpable up to 16 week size.  Fallopian tubes and ovaries appeared normal.  Procedure Details: The patient was seen in the Holding Room. The risks, benefits, complications, treatment options, and expected outcomes were discussed with the patient.  The patient concurred with the proposed plan, giving informed consent.  The site of surgery properly noted/marked. The patient was taken to the Operating Room, identified as Wende Bushy and the procedure verified as Procedure(s) (LRB): XI ROBOTIC ASSISTED TOTAL LAPAROSCOPIC HYSTERECTOMY WITH BILATERAL SALPINGECTOMY (Bilateral). A Time Out was held and the above information confirmed.  She was then placed under general anesthesia without difficulty. She was placed in the dorsal lithotomy position, and was prepped and draped in a sterile manner.  A foley catheter was inserted and placed to gravity drainage. A sterile speculum was inserted into the vagina and the cervix was grasped at the anterior lip using a single-toothed tenaculum.  The uterus was sounded to 9 cm, and a RUMI II Koh-Efficient device was placed for uterine manipulation.  The speculum  and tenaculum were then removed.  Attention was then turned to the abdomen, where  an 8 mm incision was made supraumubilcally.  The Veress needle was passed and a pneumoperitoneum was established.  The Veress needle was then removed and an 8 mm port was placed supraumbilically.  The daVinci camera was then placed supraumbilically. Three more ports were then placed. There were two 8 mm ports that were placed 10 cm laterally to the umbilicus and 2 cm inferiorly on either side.  Another 8 mm port was then placed in the left lower quadrant 2 cm medial and superiorr to the iliac crest. All incisions were injected with local anesthetic (Sensorcaine 0.5%, total of 20 cc) prior to port placement. The daVinci robot was then docked in the normal fashion. The patient was placed in steep Trendelenburg positioning.  Inspection of the pelvis showed a normal uterus, ovaries, and tubes. The right mesosalpinx of the fallopian tube was cauterized and cut using the vessel sealer device. The utero-ovarian ligament was also coagulated and cut. The round ligament was coagulated and cut. A bladder flap was created and the bladder was dissected down from the cervix.This entire procedure was then repeated on the left side.   The uterine arteries were then skeletonized, and cauterized using the vessel sealer device. There was some back bleeding appreciated from the uterine fibroids and upper segment of the uterus. This was attempted to be cauterized using the bipolar fenestrated forceps. The blue RUMI cup was then identified and an incision was made in the cervicovaginal junction on top of the vaginal cuff. Bleeding was encountered from ancillary vessels of the cervix. This was cauterized using the vessel sealer.  Due to the location of the posterior fibroid and overall bulkiness of the uterus, there was difficulty  attempting to perform the posterior colpotomy.  Also, there was still an area of slow bleeding that at this time was  unable to be identified.  At this time the decision was made to finish the hysterectomy vaginally.    Attention was then turned back to the pelvis. A weighted speculum was then placed in the vagina, and the anterior and posterior lips of the cervix were grasped bilaterally with the double-toothed tenaculum.  The posterior cul-de-sac was entered sharply using the Mayo scissors. The long-weighted speculum was then inserted into the posterior cul-de-sac (however difficulty maintaining in place due to long vaginal canal and partial obstruction from OR table.  The Heaney clamp was then used to clamp the uterosacral ligaments on either side.  They were then cut and sutured ligated with 0 Vicryl, and the ligated uterosacral ligaments were transfixed to the ipsilateral vaginal epithelium to further support the vagina and provide hemostasis.  The cardinal ligaments were then clamped, transected and ligated with 0-Vicryl . The uterus was then delivered via the posterior cul-de-sac.  At the time of removal, brisk bleeding was noted from what was suspected to be the vaginal cuff, however difficult to identify. Vaginal occluder balloon placed inside the vagina to attempt to tamponade until the robot could be resumed.    Attention was then turned back to the abdomen. The pelvis was irrigated using the robotic suction/irrigator. The vaginal cuff was closed with a running suture of 0 Vicryl V-lock. Several small ancillary vessels of the vaginal cuff were cauterized using the fenestrated bipolar forceps with good hemostasis.  The ureters were identified bilaterally. The entire pelvis was hemostatic. Arista was placed on the vaginal cuff for additional hemostasis. The robot was undocked and the patient was taken out of Trendelenburg position. . The skin was closed with 4-0 Monocryl using subcuticular stitches. An additional  total of 10 ml of 0.5% Sensorcaine was injected into the incisions. Dermabond was placed over all  incisions.  The vaginal sutures of 0-Vicryl from prior repair were trimmed.   Needle, sponge, and instrument count was correct x 3 at the end of the procedure. The patient tolerated the procedure well. Patient to the recovery room in good condition.    Estimated Blood Loss:  1300 ml      Drains: foley catheter to gravity drainage, 875 ml of clear urine at end of the procedure.          Total IV Fluids:  2800 ml  Specimens: Uterus with cervix, bilateral fallopian tubes.          Implants: None         Complications:  None; patient tolerated the procedure well.         Disposition: PACU - hemodynamically stable.  Will order stat CBC once in PACU to assess for need for blood products post-operatively.          Condition: stable   Rubie Maid, MD [facility]

## 2022-11-11 NOTE — Anesthesia Procedure Notes (Signed)
Procedure Name: Intubation Date/Time: 11/11/2022 7:37 AM  Performed by: Doreen Salvage, CRNAPre-anesthesia Checklist: Patient identified, Patient being monitored, Timeout performed, Emergency Drugs available and Suction available Patient Re-evaluated:Patient Re-evaluated prior to induction Oxygen Delivery Method: Circle system utilized Preoxygenation: Pre-oxygenation with 100% oxygen Induction Type: IV induction Ventilation: Mask ventilation without difficulty Laryngoscope Size: Mac and 3 Grade View: Grade I Tube type: Oral Tube size: 7.0 mm Number of attempts: 1 Airway Equipment and Method: Stylet Placement Confirmation: ETT inserted through vocal cords under direct vision, positive ETCO2 and breath sounds checked- equal and bilateral Secured at: 21 cm Tube secured with: Tape Dental Injury: Teeth and Oropharynx as per pre-operative assessment

## 2022-11-11 NOTE — Transfer of Care (Signed)
Immediate Anesthesia Transfer of Care Note  Patient: Julie Ochoa  Procedure(s) Performed: Procedure(s): XI ROBOTIC ASSISTED TOTAL LAPAROSCOPIC HYSTERECTOMY WITH BILATERAL SALPINGECTOMY (Bilateral)  Patient Location: PACU  Anesthesia Type:General  Level of Consciousness: sedated  Airway & Oxygen Therapy: Patient Spontanous Breathing and Patient connected to face mask oxygen  Post-op Assessment: Report given to RN and Post -op Vital signs reviewed and stable  Post vital signs: Reviewed and stable  Last Vitals:  Vitals:   11/11/22 0630 11/11/22 1211  BP: 131/80 (!) 98/32  Pulse: 78 88  Resp: 18 20  Temp: 36.8 C 36.4 C  SpO2: 123456 [id number]    Complications: No apparent anesthesia complications

## 2022-11-11 NOTE — Anesthesia Preprocedure Evaluation (Signed)
Anesthesia Evaluation  Patient identified by MRN, date of birth, ID band Patient awake    Reviewed: Allergy & Precautions, NPO status , Patient's Chart, lab work & pertinent test results  History of Anesthesia Complications Negative for: history of anesthetic complications  Airway Mallampati: III  TM Distance: >3 FB Neck ROM: full    Dental  (+) Dental Advidsory Given, Teeth Intact   Pulmonary neg pulmonary ROS, neg shortness of breath, neg COPD   Pulmonary exam normal        Cardiovascular (-) Past MI and (-) CABG negative cardio ROS Normal cardiovascular exam     Neuro/Psych  PSYCHIATRIC DISORDERS      negative neurological ROS     GI/Hepatic negative GI ROS, Neg liver ROS,,,  Endo/Other  negative endocrine ROS    Renal/GU      Musculoskeletal   Abdominal   Peds  Hematology negative hematology ROS (+)   Anesthesia Other Findings Past Medical History: No date: Anemia No date: Anxiety No date: Depression No date: Family history of ovarian cancer No date: Uterine fibroid  Past Surgical History: No date: OTHER SURGICAL HISTORY     Comment:  Tubal Reversal No date: TUBAL LIGATION  BMI    Body Mass Index: 30.46 kg/m      Reproductive/Obstetrics negative OB ROS                             Anesthesia Physical Anesthesia Plan  ASA: 2  Anesthesia Plan: General ETT   Post-op Pain Management:    Induction: Intravenous  PONV Risk Score and Plan: Ondansetron, Dexamethasone and Midazolam  Airway Management Planned: Oral ETT  Additional Equipment:   Intra-op Plan:   Post-operative Plan: Extubation in OR  Informed Consent: I have reviewed the patients History and Physical, chart, labs and discussed the procedure including the risks, benefits and alternatives for the proposed anesthesia with the patient or authorized representative who has indicated his/her understanding and  acceptance.     Dental Advisory Given  Plan Discussed with: Anesthesiologist, CRNA and Surgeon  Anesthesia Plan Comments: (Patient consented for risks of anesthesia including but not limited to:  - adverse reactions to medications - damage to eyes, teeth, lips or other oral mucosa - nerve damage due to positioning  - sore throat or hoarseness - Damage to heart, brain, nerves, lungs, other parts of body or loss of life  Patient voiced understanding.)       Anesthesia Quick Evaluation

## 2022-11-12 ENCOUNTER — Encounter: Payer: Self-pay | Admitting: Obstetrics and Gynecology

## 2022-11-12 DIAGNOSIS — N920 Excessive and frequent menstruation with regular cycle: Secondary | ICD-10-CM | POA: Diagnosis not present

## 2022-11-12 LAB — TYPE AND SCREEN
ABO/RH(D): A POS
Antibody Screen: NEGATIVE
Unit division: 0
Unit division: 0
Unit division: 0

## 2022-11-12 LAB — CBC
HCT: 26.6 % — ABNORMAL LOW (ref 36.0–46.0)
Hemoglobin: 9.2 g/dL — ABNORMAL LOW (ref 12.0–15.0)
MCH: 29.3 pg (ref 26.0–34.0)
MCHC: 34.6 g/dL (ref 30.0–36.0)
MCV: 84.7 fL (ref 80.0–100.0)
Platelets: 131 10*3/uL — ABNORMAL LOW (ref 150–400)
RBC: 3.14 MIL/uL — ABNORMAL LOW (ref 3.87–5.11)
RDW: 13.2 % (ref 11.5–15.5)
WBC: 9.9 10*3/uL (ref 4.0–10.5)
nRBC: 0 % (ref 0.0–0.2)

## 2022-11-12 LAB — BPAM RBC
Blood Product Expiration Date: 202404182359
Blood Product Expiration Date: 202404182359
Blood Product Expiration Date: 202404182359
ISSUE DATE / TIME: 202403181411
ISSUE DATE / TIME: 202403181739
Unit Type and Rh: 6200
Unit Type and Rh: 6200
Unit Type and Rh: 6200

## 2022-11-12 LAB — PREPARE RBC (CROSSMATCH)

## 2022-11-12 NOTE — Discharge Summary (Signed)
Gynecology Physician Postoperative Discharge Summary  Patient ID: Julie Ochoa MRN: [id number] DOB/AGE: Dec 25, 1980 42 y.o.  Admit Date: 11/11/2022 Discharge Date: 11/12/2022  Preoperative Diagnoses: Menorrhagia with regular cycle, fibroid uterus, history of anemia, s/p Depot Lupron therapy  Procedures: Procedure(s) (LRB): XI ROBOTIC ASSISTED TOTAL LAPAROSCOPIC HYSTERECTOMY WITH BILATERAL SALPINGECTOMY (Bilateral)  Hospital Course:  Julie Ochoa is a 42 y.o. [id number] admitted for scheduled surgery.  She underwent the procedures as mentioned above, her operation was complicated by excessive blood loss. For further details about surgery, please refer to the operative report.  She was admitted overnight for observation and received 2 units of PRBCs post-operatively. Patient had an otherwise uncomplicated postoperative course. By time of discharge on POD#1, her pain was controlled on oral pain medications; she was ambulating, voiding without difficulty, tolerating regular diet and passing flatus. Denied any symptoms of anemia. She was deemed stable for discharge to home.   Significant Labs:    Latest Ref Rng & Units 11/12/2022    6:46 AM 11/11/2022    1:00 PM 11/07/2022    8:44 AM  CBC  WBC 4.0 - 10.5 K/uL 9.9   4.7   Hemoglobin 12.0 - 15.0 g/dL 9.2  7.8  13.6   Hematocrit 36.0 - 46.0 % 26.6  23.5  42.1   Platelets 150 - 400 K/uL 131   263     Discharge Exam: Blood pressure 108/74, pulse 84, temperature 98.6 F (37 C), temperature source Oral, resp. rate 18, height 5\' 3"  (1.6 m), weight 78 kg, last menstrual period 09/12/2022, SpO2 96 %. General appearance: alert and no distress  Resp: clear to auscultation bilaterally  Cardio: regular rate and rhythm  GI: soft, non-tender; bowel sounds normal; no masses, no organomegaly.  Incision: C/D/I, no erythema, no drainage noted Pelvic: scant blood on pad (done in presence of RN as chaperone)  Extremities: extremities normal,  atraumatic, no cyanosis or edema and Homans sign is negative, no sign of DVT  Discharged Condition: Stable  Disposition: Discharge disposition: 01-Home or Self Care       Discharge Instructions     Discharge patient   Complete by: As directed    After 2nd unit of blood completed.   Discharge disposition: 01-Home or Self Care   Discharge patient date: 11/11/2022      Allergies as of 11/12/2022   No Known Allergies      Medication List     STOP taking these medications    IRON PO       TAKE these medications    docusate sodium 100 MG capsule Commonly known as: COLACE Take 1 capsule (100 mg total) by mouth 2 (two) times daily as needed.   ferrous sulfate 325 (65 FE) MG EC tablet Take 1 tablet (325 mg total) by mouth 2 (two) times daily.   FLUoxetine 20 MG tablet Commonly known as: PROZAC Take 20 mg by mouth daily.   ibuprofen 600 MG tablet Commonly known as: ADVIL Take 1 tablet (600 mg total) by mouth every 6 (six) hours as needed.   oxyCODONE-acetaminophen 5-325 MG tablet Commonly known as: Percocet Take 1-2 tablets by mouth every 6 (six) hours as needed for severe pain or moderate pain.   simethicone 80 MG chewable tablet Commonly known as: Gas-X Chew 1 tablet (80 mg total) by mouth 4 (four) times daily as needed for flatulence.       Future Appointments  Date Time Provider [place]  11/19/2022 10:35 AM Rubie Maid, MD  [facility] None    Follow-up Information     Rubie Maid, MD Follow up.   Specialties: Obstetrics and Gynecology, Radiology Contact information: [place] [address] [facility] Follow up.   Specialty: Obstetrics and Gynecology Why: Follow up with Dr. Marcelline Mates for post-op check in 7 -10 days Contact information: [place] [id number] [phone]                Total discharge time: 15 minutes    Signed: Rubie Maid, MD [place] at [facility]

## 2022-11-12 NOTE — Addendum Note (Signed)
Addendum  created 11/12/22 0749 by Dimas Millin, MD   [place] recorded in [place], [place] filed

## 2022-11-12 NOTE — Progress Notes (Signed)
.  Patient discharged home with family.  Discharge instructions, when to follow up, and prescriptions reviewed with patient.  Patient verbalized understanding. Patient wanted to walk out

## 2022-11-12 NOTE — Progress Notes (Signed)
Post-Operative Day # 1, s/p robotic total laparoscopic hysterectomy with bilateral salpingectomy. Admitted overnight for blood transfusion due to excessive surgical blood loss.   Subjective: no complaints, up ad lib, voiding, tolerating PO, and + flatus  Objective: Temp:  [97.6 F (36.4 C)-99.6 F (37.6 C)] 98.9 F (37.2 C) (03/19 0312) Pulse Rate:  [76-96] 87 (03/19 0312) Resp:  [8-23] 20 (03/19 0312) BP: (93-143)/(32-98) 115/69 (03/19 0312) SpO2:  [95 %-100 %] 96 % (03/19 0312)  Physical Exam:  General: alert and no distress  Lungs: clear to auscultation bilaterally Heart: regular rate and rhythm, S1, S2 normal, no murmur, click, rub or gallop Abdomen: soft, non-tender; bowel sounds normal; no masses,  no organomegaly Pelvis:Bleeding: appropriate,  Incision: healing well, no significant drainage, no dehiscence, no significant erythema Extremities: DVT Evaluation: No evidence of DVT seen on physical exam. Negative Homan's sign. No cords or calf tenderness. No significant calf/ankle edema.   Labs:    Latest Ref Rng & Units 11/12/2022    6:46 AM 11/11/2022    1:00 PM 11/07/2022    8:44 AM  CBC  WBC 4.0 - 10.5 K/uL 9.9   4.7   Hemoglobin 12.0 - 15.0 g/dL 9.2  7.8  13.6   Hematocrit 36.0 - 46.0 % 26.6  23.5  42.1   Platelets 150 - 400 K/uL 131   263      Lab Results  Component Value Date   CREATININE 0.83 11/07/2022     Assessment/Plan: Doing well postoperatively. Regular diet as tolerated Encourage ambulation S/p 2 units PRBCs overnight. Tolerated well.  Discharge home today    Rubie Maid, [facility]

## 2022-11-13 LAB — SURGICAL PATHOLOGY

## 2022-11-15 NOTE — Progress Notes (Unsigned)
    OBSTETRICS/GYNECOLOGY POST-OPERATIVE CLINIC VISIT  Subjective:     Julie Ochoa is a 42 y.o. female who presents to the clinic 1 weeks status post XI ROBOTIC [place] SALPINGECTOMY for dysfunctional uterine bleeding, uterine fibroids.  Post-operative course complicated by intraoperative hemorrhage, requiring blood transfusion (2 units PRBCs).  Eating a regular diet without difficulty. Bowel movements are normal. Pain is controlled with current analgesics. Medications being used: prescription NSAID's including ibuprofen (Motrin). She has one incision that is bothersome, no pain but tenderness.  The following portions of the patient's history were reviewed and updated as appropriate: allergies, current medications, past family history, past medical history, past social history, past surgical history, and problem list.  Review of Systems Pertinent items are noted in HPI.   Objective:   BP 124/70   Pulse 83   Resp 16   Ht 5\' 3"  (1.6 m)   Wt 165 lb (74.8 kg)   LMP 09/12/2022 (Exact Date)   BMI 29.23 kg/m    General:  alert and no distress  Abdomen: soft, bowel sounds active, non-tender  Incision:   healing well, no drainage, no erythema, no hernia, no seroma, no swelling, no dehiscence, incision well approximated    Labs:     Latest Ref Rng & Units 11/12/2022    6:46 AM 11/11/2022    1:00 PM 11/07/2022    8:44 AM  CBC  WBC 4.0 - 10.5 K/uL 9.9   4.7   Hemoglobin 12.0 - 15.0 g/dL 9.2  7.8  13.6   Hematocrit 36.0 - 46.0 % 26.6  23.5  42.1   Platelets 150 - 400 K/uL 131   263     Pathology:   A. UTERUS WITH CERVIX AND BILATERAL FALLOPIAN TUBES; TOTAL HYSTERECTOMY  WITH BILATERAL SALPINGECTOMY:  - UTERINE CERVIX:       - BENIGN TRANSFORMATION ZONE.       - NEGATIVE FOR SQUAMOUS INTRAEPITHELIAL LESION AND MALIGNANCY.  - ENDOMETRIUM:       - LATE PROLIFERATIVE/EARLY SECRETORY PHASE ENDOMETRIUM WITH  GLANDULAR AND STROMAL  BREAKDOWN.       - NEGATIVE FOR ATYPICAL HYPERPLASIA/EIN AND MALIGNANCY.  - MYOMETRIUM:       - LEIOMYOMATA UTERI.       - NEGATIVE FOR FEATURES OF MALIGNANCY.  - FALLOPIAN TUBES:       - MODERATE VASCULAR CONGESTION; OTHERWISE NO SIGNIFICANT  HISTOPATHOLOGIC CHANGE.    Assessment:   - Patient s/p XI ROBOTIC ASSISTED TOTAL LAPAROSCOPIC HYSTERECTOMY WITH BILATERAL SALPINGECTOMY  (surgery)  - Post-operative anemia (secondary to acute blood loss).  - Mixed anxiety and depression disorder.   Plan:   1. Continue any current medications as instructed by provider. 2. Wound care discussed. 3. Operative findings again reviewed. Pathology report discussed. 4. Activity restrictions: no bending, stooping, or squatting, no lifting more than 10-15 pounds, and pelvic rest.  5. Anticipated return to work:  12/12/22  but thinking of resuming next week as she is feeling well. Working from home. 6. Desires to increase her Prozac from 10 mg to 20 mg.  Needs new prescription.  7. Will recheck Hgb levels at final post-op check. Is taking iron supplements PO.  8. Follow up: 5 weeks for 6 week post op    Rubie Maid, MD [place]

## 2022-11-19 ENCOUNTER — Encounter: Payer: Self-pay | Admitting: Obstetrics and Gynecology

## 2022-11-19 ENCOUNTER — Ambulatory Visit: Payer: No Typology Code available for payment source | Admitting: Obstetrics and Gynecology

## 2022-11-19 VITALS — BP 124/70 | HR 83 | Resp 16 | Ht 63.0 in | Wt 165.0 lb

## 2022-11-19 DIAGNOSIS — Z90710 Acquired absence of both cervix and uterus: Secondary | ICD-10-CM

## 2022-11-19 DIAGNOSIS — Z4889 Encounter for other specified surgical aftercare: Secondary | ICD-10-CM

## 2022-11-19 DIAGNOSIS — Z862 Personal history of diseases of the blood and blood-forming organs and certain disorders involving the immune mechanism: Secondary | ICD-10-CM

## 2022-11-19 DIAGNOSIS — F418 Other specified anxiety disorders: Secondary | ICD-10-CM

## 2022-11-19 DIAGNOSIS — N9961 Intraoperative hemorrhage and hematoma of a genitourinary system organ or structure complicating a genitourinary system procedure: Secondary | ICD-10-CM

## 2022-11-19 MED ORDER — FLUOXETINE HCL 20 MG PO TABS: 20.0000 mg | ORAL_TABLET | Freq: Every day | ORAL | 3 refills | Status: DC

## 2022-11-27 ENCOUNTER — Encounter: Payer: Self-pay | Admitting: Obstetrics and Gynecology

## 2022-12-06 ENCOUNTER — Telehealth: Payer: Self-pay

## 2022-12-06 MED ORDER — METRONIDAZOLE 500 MG PO TABS: 500.0000 mg | ORAL_TABLET | Freq: Two times a day (BID) | ORAL | 0 refills | Status: DC

## 2022-12-06 NOTE — Telephone Encounter (Signed)
Responded to patient via Mychart message.

## 2022-12-23 NOTE — Progress Notes (Unsigned)
    OBSTETRICS/GYNECOLOGY POST-OPERATIVE CLINIC VISIT  Subjective:     Julie Ochoa is a 42 y.o. female who presents to the clinic 5 weeks status post  XI ROBOTIC ASSISTED TOTAL LAPAROSCOPIC HYSTERECTOMY WITH BILATERAL SALPINGECTOMY for  Dysfunctional uterine bleeding, uterine fibroids, symptomatic anemia.  Eating a regular diet without difficulty. Bowel movements are abnormal with some constipation . The patient is not having any pain. She does have some tenderness in lower abdomen that comes and goes, mostly at the end of the day after moving around all day.  The following portions of the patient's history were reviewed and updated as appropriate: allergies, current medications, past family history, past medical history, past social history, past surgical history, and problem list.  Review of Systems Pertinent items noted in HPI and remainder of comprehensive ROS otherwise negative.   Objective:   BP 125/74   Pulse 69   Wt 166 lb (75.3 kg)   BMI 29.41 kg/m  Body mass index is 29.41 kg/m.  General:  alert and no distress  Abdomen: soft, bowel sounds active, non-tender  Incision:   healing well, no drainage, no erythema, no hernia, no seroma, no swelling, no dehiscence, incision well approximated  Pelvis:  external genitalia normal, rectovaginal septum normal.  Vagina with small amount of yellow discharge. Uterus and cervix surgically absent, vaginal cuff healing well. Small area of granulation tissue noted at left vaginal wall.      Pathology:    A. UTERUS WITH CERVIX AND BILATERAL FALLOPIAN TUBES; TOTAL HYSTERECTOMY  WITH BILATERAL SALPINGECTOMY:  - UTERINE CERVIX:       - BENIGN TRANSFORMATION ZONE.       - NEGATIVE FOR SQUAMOUS INTRAEPITHELIAL LESION AND MALIGNANCY.  - ENDOMETRIUM:       - LATE PROLIFERATIVE/EARLY SECRETORY PHASE ENDOMETRIUM WITH  GLANDULAR AND STROMAL BREAKDOWN.       - NEGATIVE FOR ATYPICAL HYPERPLASIA/EIN AND MALIGNANCY.  - MYOMETRIUM:       -  LEIOMYOMATA UTERI.       - NEGATIVE FOR FEATURES OF MALIGNANCY.  - FALLOPIAN TUBES:       - MODERATE VASCULAR CONGESTION; OTHERWISE NO SIGNIFICANT  HISTOPATHOLOGIC CHANGE.     Labs:     Latest Ref Rng & Units 12/24/2022    9:42 AM 11/12/2022    6:46 AM 11/11/2022    1:00 PM  CBC  WBC 4.0 - 10.5 K/uL  9.9    Hemoglobin 11 - 14.6 g/dL 54.0  9.2  7.8   Hematocrit 36.0 - 46.0 %  26.6  23.5   Platelets 150 - 400 K/uL  131      Assessment:   Patient s/p  XI ROBOTIC ASSISTED TOTAL LAPAROSCOPIC HYSTERECTOMY WITH BILATERAL SALPINGECTOMY  (surgery)  Symptomatic anemia Doing well postoperatively.   Plan:   1. Continue any current medications as instructed by provider.  Can discontinue iron at this time as Hgb has returned to normal.  Advised that this may help her constipation as well.  2. Wound care discussed. 3. Operative findings again reviewed. Pathology report discussed. 4. Activity restrictions: no bending, stooping, or squatting and no lifting more than 15 pounds for next 2 weeks. Also pelvic rest x 2 weeks.  5. Anticipated return to work:  Has already returned back to work . 6. Follow up:  5 months for annual exam    Hildred Laser, MD  [facility]

## 2022-12-24 ENCOUNTER — Encounter: Payer: Self-pay | Admitting: Obstetrics and Gynecology

## 2022-12-24 ENCOUNTER — Ambulatory Visit (INDEPENDENT_AMBULATORY_CARE_PROVIDER_SITE_OTHER): Payer: No Typology Code available for payment source | Admitting: Obstetrics and Gynecology

## 2022-12-24 VITALS — BP 125/74 | HR 69 | Wt 166.0 lb

## 2022-12-24 DIAGNOSIS — D508 Other iron deficiency anemias: Secondary | ICD-10-CM | POA: Diagnosis not present

## 2022-12-24 DIAGNOSIS — Z4889 Encounter for other specified surgical aftercare: Secondary | ICD-10-CM

## 2022-12-24 DIAGNOSIS — Z90710 Acquired absence of both cervix and uterus: Secondary | ICD-10-CM

## 2022-12-24 DIAGNOSIS — Z86018 Personal history of other benign neoplasm: Secondary | ICD-10-CM

## 2022-12-24 LAB — POCT HEMOGLOBIN: Hemoglobin: 13.4 g/dL (ref 11–14.6)

## 2023-02-11 ENCOUNTER — Telehealth: Payer: Self-pay

## 2023-02-11 NOTE — Telephone Encounter (Signed)
Pt calling to see if she had STD testing before her hyst or in the last six months; if so what were they and the restuls?  [phone]  Pt states since hyst every time her and her hsb have sex three days later they both feel flu-like sxs.  Adv pt I have never heard of that.  Pt wanted to know what the sxs are for STDs; adv discharge, odor, itching, and irritation.  Pt states she is going to do covid tests today and see if maybe they are just passing something back and forth.  Hoped they feel better soon.

## 2023-02-11 NOTE — Telephone Encounter (Signed)
Never heard of this as well. She was tested for HIV and Hepatitis C prior to her surgery, due to it being an abdominal procedure with the possibility of blood exposure. With regards to cervical screening, she was not as she did not have any symptoms or indication for testing. Now that she has had a hysterectomy it is not likely to contract gonorrhea or chlamydia.

## 2023-05-14 IMAGING — MG MM DIGITAL SCREENING BILAT W/ TOMO AND CAD
8 series · 8 of 24 positions shown · non-contrast
Comparison: Previous exam(s).

CLINICAL DATA: Screening.

EXAM:
DIGITAL SCREENING BILATERAL MAMMOGRAM WITH TOMOSYNTHESIS AND CAD
TECHNIQUE: Bilateral screening digital craniocaudal and mediolateral oblique
mammograms were obtained. Bilateral screening digital breast
tomosynthesis was performed. The images were evaluated with
computer-aided detection.

[R MLO synth-2D]
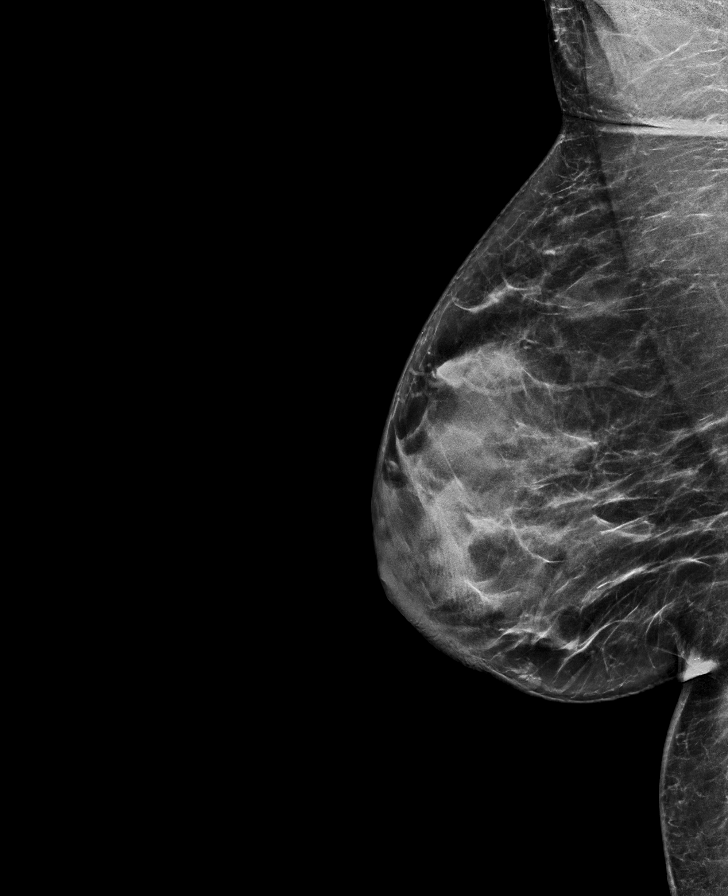

[R CC synth-2D]
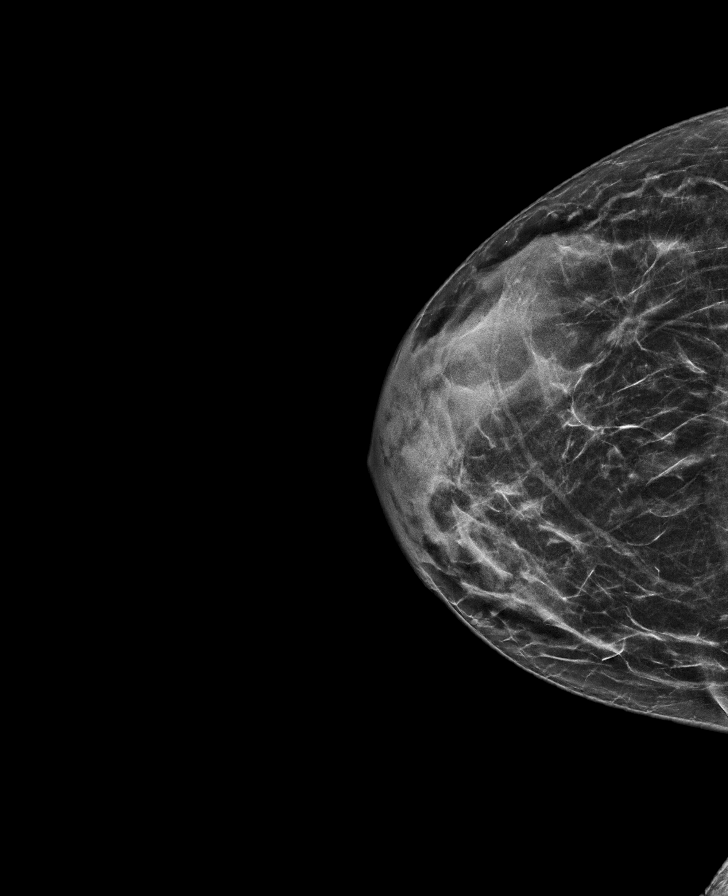

[L CC synth-2D]
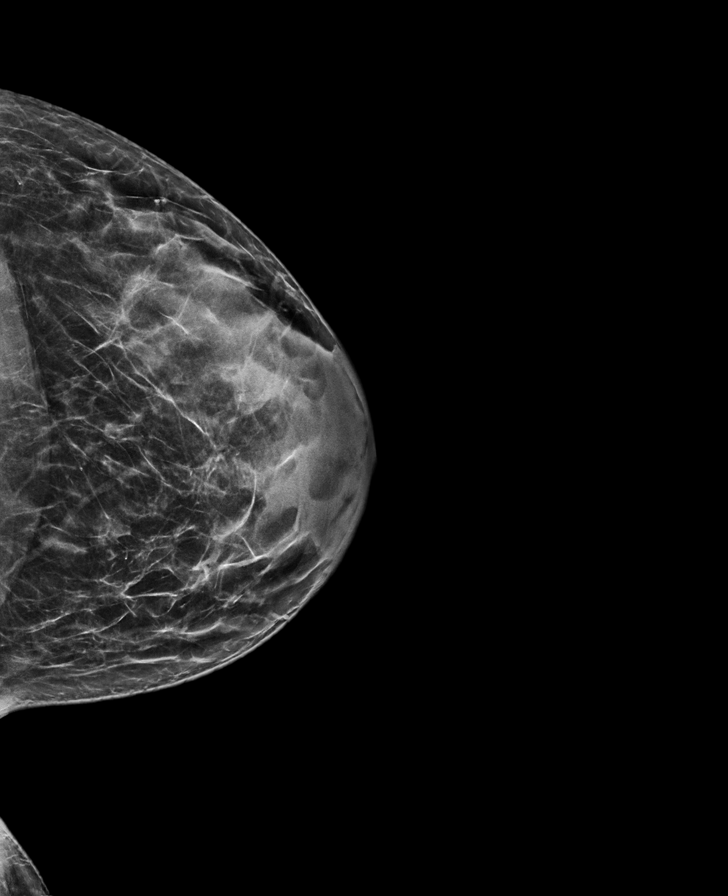

[L MLO synth-2D]
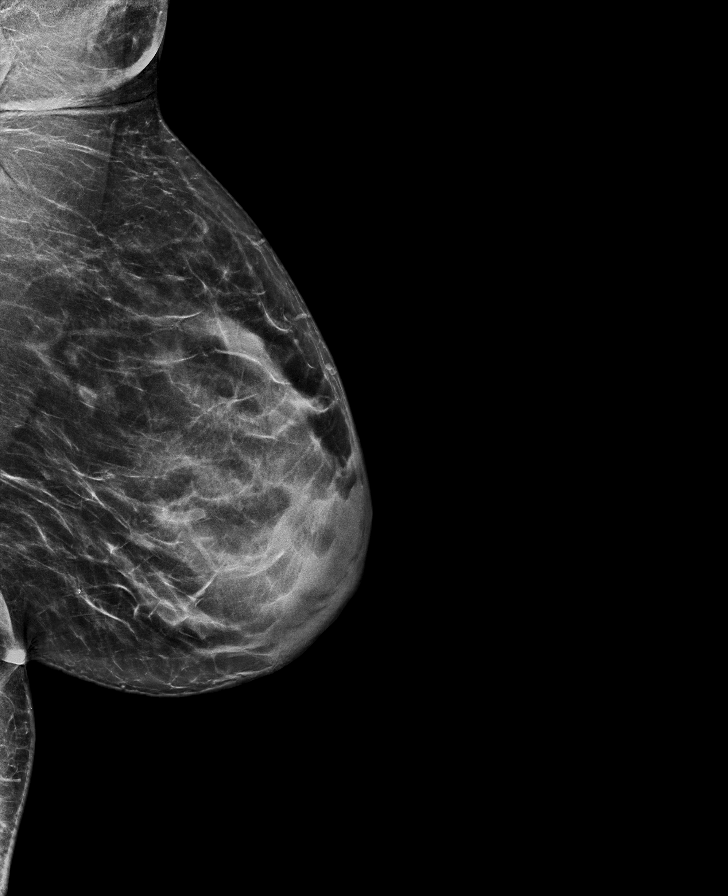

[L MLO tomo · tomo slice 35/68.0]
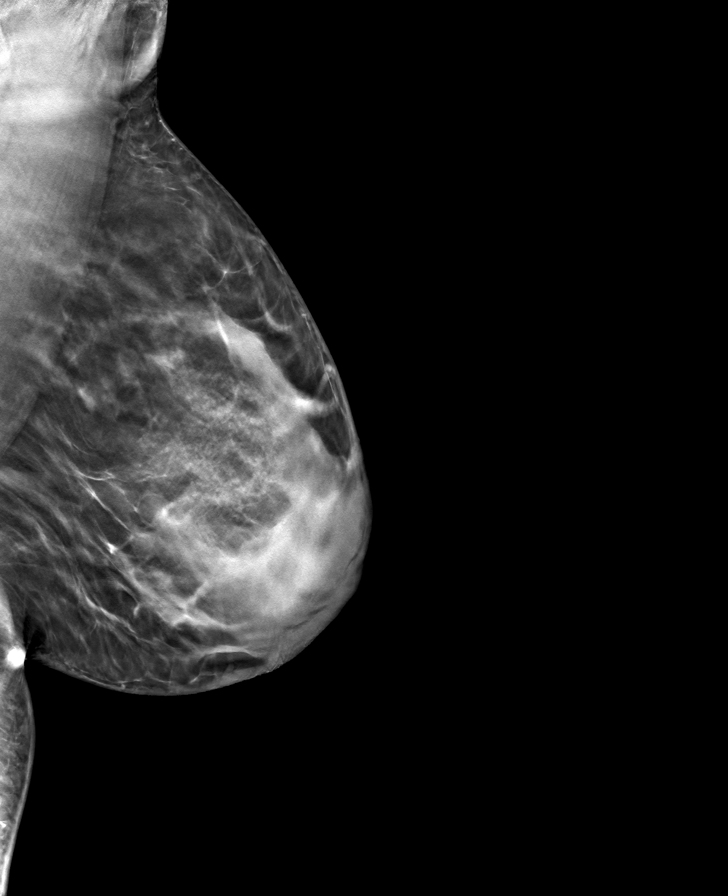

[R CC tomo · tomo slice 29/56.0]
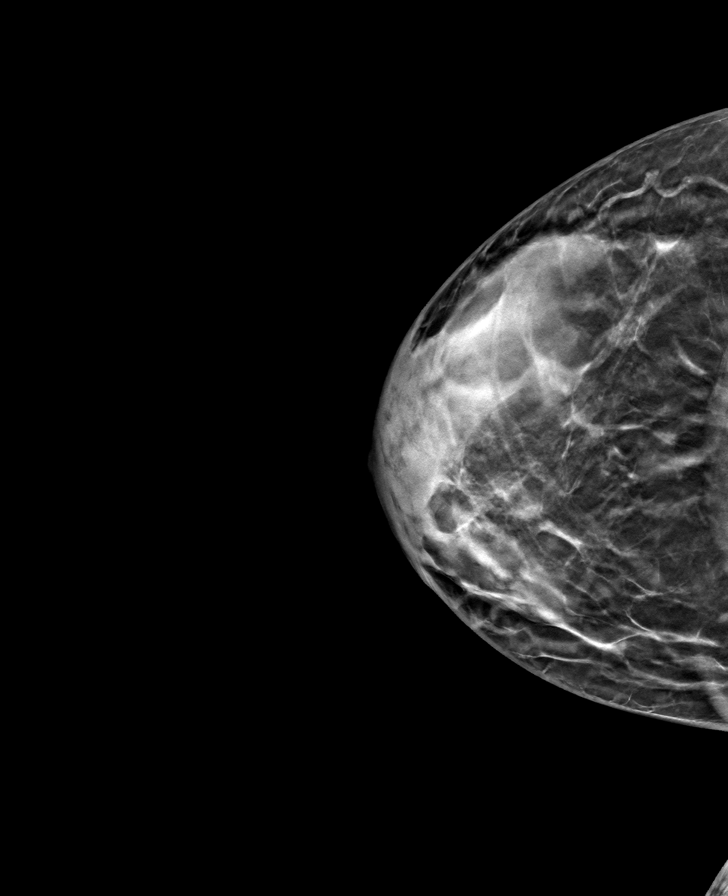

[L CC tomo · tomo slice 32/63.0]
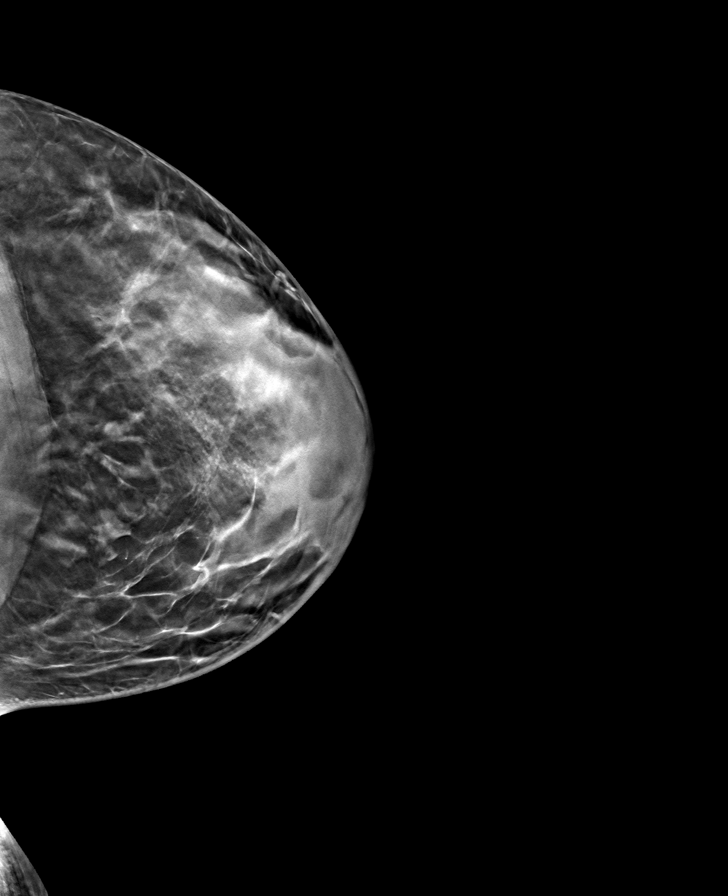

[R MLO tomo · tomo slice 34/67.0]
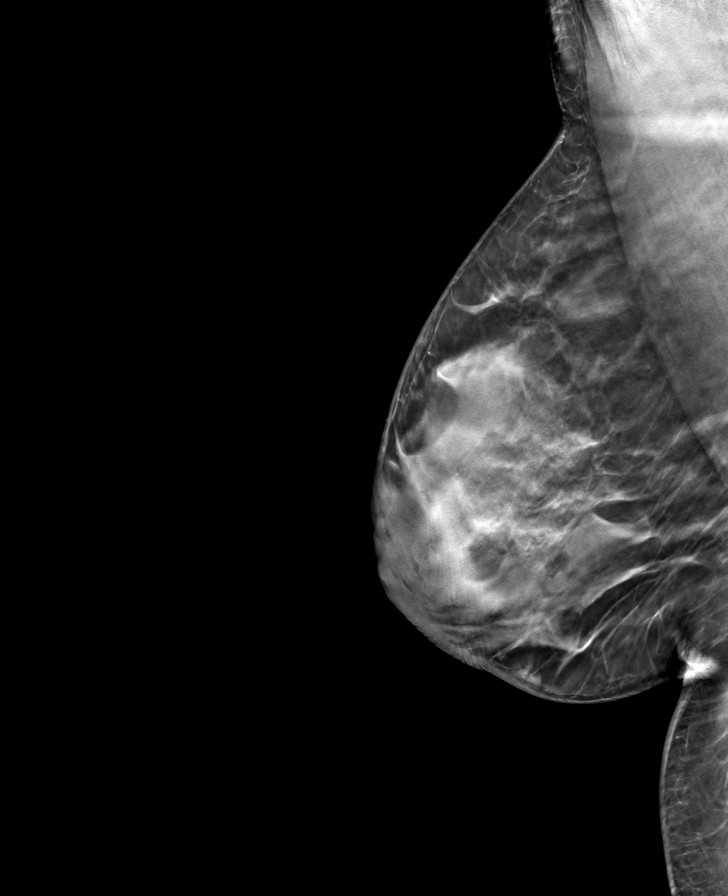

[8 of 24 positions shown; findings below may reference images not displayed]

ACR Breast Density Category c: The breast tissue is heterogeneously
dense, which may obscure small masses.
FINDINGS: There are no findings suspicious for malignancy.
IMPRESSION: No mammographic evidence of malignancy. A result letter of this
screening mammogram will be mailed directly to the patient.

RECOMMENDATION:
Screening mammogram in one year. (Code:Q3-W-BC3)

BI-RADS CATEGORY  1: Negative.

## 2024-01-07 NOTE — Progress Notes (Unsigned)
 Patient, No Pcp Per   No chief complaint on file.   HPI:      [name] is a 43 y.o. [id number] whose LMP was No LMP recorded., presents today for *** Neg mammo 01/28/22; FH ovarian cancer in her MGM, colon and stomach on mat side S/p lap hyst for leio  Patient Active Problem List   Diagnosis Date Noted   S/P laparoscopic hysterectomy 11/11/2022   Menometrorrhagia 01/14/2022   Leiomyoma 01/14/2022   Family history of ovarian cancer 02/13/2021   Abnormal cervical Papanicolaou smear 03/16/2019   Anemia 03/16/2019   Enlarged uterus 03/16/2019   Mixed anxiety and depressive disorder 03/16/2019   Suicidal ideation 08/07/2018   Major depressive disorder, recurrent episode (HCC) 08/06/2018    Past Surgical History:  Procedure Laterality Date   OTHER SURGICAL HISTORY     Tubal Reversal   ROBOTIC ASSISTED TOTAL HYSTERECTOMY WITH BILATERAL SALPINGO OOPHERECTOMY Bilateral 11/11/2022   Procedure: XI ROBOTIC ASSISTED TOTAL LAPAROSCOPIC HYSTERECTOMY WITH BILATERAL SALPINGECTOMY;  Surgeon: Teresa Fender, MD;  Location: [facility] OR[facility];  Service: Gynecology;  Laterality: Bilateral;   TUBAL LIGATION      Family History  Problem Relation Age of Onset   Leukemia Sister    Ovarian cancer Maternal Grandmother        29s   Prostate cancer Paternal Grandfather    Stomach cancer Maternal Aunt    Breast cancer Cousin    Colon cancer Maternal Uncle     Social History   Socioeconomic History   Marital status: Married    Spouse name: Ygnacio Hemming   Number of children: 2   Years of education: Not on file   Highest education level: Not on file  Occupational History   Not on file  Tobacco Use   Smoking status: Never   Smokeless tobacco: Never  Vaping Use   Vaping status: Never Used  Substance and Sexual Activity   Alcohol use: Yes    Comment: occassional   Drug use: No   Sexual activity: Yes    Birth control/protection: Surgical    Comment: Tubal Ligation  Other Topics Concern    Not on file  Social History Narrative   Not on file   Social Drivers of Health   Financial Resource Strain: Not on file  Food Insecurity: Not on file  Transportation Needs: Not on file  Physical Activity: Not on file  Stress: Not on file  Social Connections: Not on file  Intimate Partner Violence: Low Risk  (01/13/2021)   Received [facility]   Intimate Partner Violence    Insults You: Not on file    Threatens You: Not on file    Screams at You: Not on file    Physically Hurt: Not on file    Intimate Partner Violence Score: Not on file    Outpatient Medications Prior to Visit  Medication Sig Dispense Refill   docusate sodium  (COLACE) 100 MG capsule Take 1 capsule (100 mg total) by mouth 2 (two) times daily as needed. 30 capsule 2   ferrous sulfate  325 (65 FE) MG EC tablet Take 1 tablet (325 mg total) by mouth 2 (two) times daily. 60 tablet 2   FLUoxetine  (PROZAC ) 20 MG tablet Take 1 tablet (20 mg total) by mouth daily. 90 tablet 3   ibuprofen  (ADVIL ) 600 MG tablet Take 1 tablet (600 mg total) by mouth every 6 (six) hours as needed. 60 tablet 3   metroNIDAZOLE  (FLAGYL ) 500 MG tablet Take  1 tablet (500 mg total) by mouth 2 (two) times daily. 14 tablet 0   simethicone  (GAS-X) 80 MG chewable tablet Chew 1 tablet (80 mg total) by mouth 4 (four) times daily as needed for flatulence. 30 tablet 1   No facility-administered medications prior to visit.      ROS:  Review of Systems BREAST: No symptoms   OBJECTIVE:   Vitals:  There were no vitals taken for this visit.  Physical Exam  Results: No results found for this or any previous visit (from the past 24 hours).   Assessment/Plan: No diagnosis found.    No orders of the defined types were placed in this encounter.     No follow-ups on file.  Ambrea Hegler B. Rionna Feltes, PA-C 01/07/2024 4:26 PM

## 2024-01-08 ENCOUNTER — Ambulatory Visit: Admitting: Obstetrics and Gynecology

## 2024-01-08 ENCOUNTER — Encounter: Payer: Self-pay | Admitting: Obstetrics and Gynecology

## 2024-01-08 VITALS — BP 126/75 | HR 69 | Ht 63.0 in | Wt 176.0 lb

## 2024-01-08 DIAGNOSIS — R2232 Localized swelling, mass and lump, left upper limb: Secondary | ICD-10-CM

## 2024-01-08 DIAGNOSIS — Z1231 Encounter for screening mammogram for malignant neoplasm of breast: Secondary | ICD-10-CM

## 2024-01-08 DIAGNOSIS — Z8041 Family history of malignant neoplasm of ovary: Secondary | ICD-10-CM

## 2024-01-08 DIAGNOSIS — Z780 Asymptomatic menopausal state: Secondary | ICD-10-CM

## 2024-01-08 DIAGNOSIS — M79622 Pain in left upper arm: Secondary | ICD-10-CM | POA: Diagnosis not present

## 2024-01-08 DIAGNOSIS — R2230 Localized swelling, mass and lump, unspecified upper limb: Secondary | ICD-10-CM

## 2024-01-08 NOTE — Patient Instructions (Signed)
 I value your feedback and you entrusting [place] with your care. If you get a [facility] patient survey, I would appreciate you taking the time to let us know about your experience today. Thank you!  [facility] ([place])--[phone]

## 2024-01-09 LAB — FSH/LH
FSH: 6.1 m[IU]/mL
LH: 5.4 m[IU]/mL

## 2024-01-09 LAB — ESTRADIOL: Estradiol: 97.7 pg/mL

## 2024-01-11 ENCOUNTER — Ambulatory Visit: Payer: Self-pay | Admitting: Obstetrics and Gynecology

## 2024-01-13 ENCOUNTER — Telehealth: Payer: Self-pay

## 2024-01-13 NOTE — Telephone Encounter (Signed)
 Pls call pt to do GAD/PHQ for sx. Since cherry gone, I can refill now but she should have PCP manage if seeing them.

## 2024-01-14 ENCOUNTER — Other Ambulatory Visit: Payer: Self-pay | Admitting: Obstetrics and Gynecology

## 2024-01-14 MED ORDER — FLUOXETINE HCL 20 MG PO TABS: 20.0000 mg | ORAL_TABLET | Freq: Every day | ORAL | 3 refills | Status: AC

## 2024-01-14 NOTE — Telephone Encounter (Signed)
PHQ - 6  GAD - 4

## 2024-01-14 NOTE — Telephone Encounter (Signed)
 Called pt, no answer, LVMTRC.

## 2024-01-14 NOTE — Telephone Encounter (Signed)
Rx RF eRxd.  

## 2024-01-14 NOTE — Progress Notes (Signed)
 Rx RF prozac 

## 2024-01-20 ENCOUNTER — Ambulatory Visit
Admission: RE | Admit: 2024-01-20 | Discharge: 2024-01-20 | Disposition: A | Discharge status: Home / Self Care | Source: Ambulatory Visit | Attending: Obstetrics and Gynecology | Admitting: Obstetrics and Gynecology

## 2024-01-20 ENCOUNTER — Other Ambulatory Visit: Payer: Self-pay | Admitting: Obstetrics and Gynecology

## 2024-01-20 ENCOUNTER — Other Ambulatory Visit

## 2024-01-20 DIAGNOSIS — Z1231 Encounter for screening mammogram for malignant neoplasm of breast: Secondary | ICD-10-CM | POA: Insufficient documentation

## 2024-01-20 DIAGNOSIS — M79622 Pain in left upper arm: Secondary | ICD-10-CM

## 2024-01-20 DIAGNOSIS — Z780 Asymptomatic menopausal state: Secondary | ICD-10-CM

## 2024-01-20 DIAGNOSIS — Z8041 Family history of malignant neoplasm of ovary: Secondary | ICD-10-CM

## 2024-01-20 DIAGNOSIS — R2232 Localized swelling, mass and lump, left upper limb: Secondary | ICD-10-CM

## 2024-06-17 ENCOUNTER — Ambulatory Visit: Admitting: Physician Assistant
